# Patient Record
Sex: Female | Born: 1995 | Race: Black or African American | Hispanic: Yes | Marital: Single | Smoking: Never smoker
Health system: Southern US, Community
[De-identification: ages and names within clinical notes are randomized; demographics above are authoritative.]

## PROBLEM LIST (undated history)

## (undated) ENCOUNTER — Emergency Department (HOSPITAL_COMMUNITY): Admission: EM | Disposition: A | Discharge status: Home / Self Care | Payer: Self-pay

## (undated) ENCOUNTER — Inpatient Hospital Stay (HOSPITAL_COMMUNITY): Payer: Self-pay

## (undated) DIAGNOSIS — R51 Headache: Secondary | ICD-10-CM

## (undated) DIAGNOSIS — B999 Unspecified infectious disease: Secondary | ICD-10-CM

## (undated) DIAGNOSIS — A749 Chlamydial infection, unspecified: Secondary | ICD-10-CM

## (undated) DIAGNOSIS — R519 Headache, unspecified: Secondary | ICD-10-CM

## (undated) DIAGNOSIS — E162 Hypoglycemia, unspecified: Secondary | ICD-10-CM

## (undated) HISTORY — PX: WISDOM TOOTH EXTRACTION: SHX21

## (undated) HISTORY — PX: DILATION AND CURETTAGE OF UTERUS: SHX78

---
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
## 2014-02-02 ENCOUNTER — Encounter (HOSPITAL_COMMUNITY): Payer: Self-pay | Admitting: Emergency Medicine

## 2014-02-02 ENCOUNTER — Emergency Department (HOSPITAL_COMMUNITY)
Admission: EM | Admit: 2014-02-02 | Discharge: 2014-02-02 | Disposition: A | Discharge status: Home / Self Care | Payer: Medicaid Other | Attending: Emergency Medicine | Admitting: Emergency Medicine

## 2014-02-02 DIAGNOSIS — N938 Other specified abnormal uterine and vaginal bleeding: Secondary | ICD-10-CM | POA: Diagnosis not present

## 2014-02-02 DIAGNOSIS — Z3202 Encounter for pregnancy test, result negative: Secondary | ICD-10-CM | POA: Insufficient documentation

## 2014-02-02 DIAGNOSIS — N926 Irregular menstruation, unspecified: Secondary | ICD-10-CM | POA: Diagnosis not present

## 2014-02-02 DIAGNOSIS — Z79899 Other long term (current) drug therapy: Secondary | ICD-10-CM | POA: Diagnosis not present

## 2014-02-02 DIAGNOSIS — L259 Unspecified contact dermatitis, unspecified cause: Secondary | ICD-10-CM | POA: Diagnosis not present

## 2014-02-02 DIAGNOSIS — Z8679 Personal history of other diseases of the circulatory system: Secondary | ICD-10-CM | POA: Diagnosis not present

## 2014-02-02 DIAGNOSIS — N939 Abnormal uterine and vaginal bleeding, unspecified: Secondary | ICD-10-CM | POA: Diagnosis present

## 2014-02-02 DIAGNOSIS — Z8639 Personal history of other endocrine, nutritional and metabolic disease: Secondary | ICD-10-CM | POA: Diagnosis not present

## 2014-02-02 DIAGNOSIS — N921 Excessive and frequent menstruation with irregular cycle: Secondary | ICD-10-CM

## 2014-02-02 HISTORY — DX: Hypoglycemia, unspecified: E16.2

## 2014-02-02 LAB — HEMOGLOBIN AND HEMATOCRIT, BLOOD
HEMATOCRIT: 43 % (ref 36.0–46.0)
HEMOGLOBIN: 14.3 g/dL (ref 12.0–15.0)

## 2014-02-02 LAB — WET PREP, GENITAL
Trich, Wet Prep: NONE SEEN
WBC, Wet Prep HPF POC: NONE SEEN
YEAST WET PREP: NONE SEEN

## 2014-02-02 LAB — POC URINE PREG, ED: PREG TEST UR: NEGATIVE

## 2014-02-02 MED ORDER — DIPHENHYDRAMINE HCL 25 MG PO TABS: 25.0000 mg | ORAL_TABLET | Freq: Four times a day (QID) | ORAL | Status: DC | PRN

## 2014-02-02 MED ORDER — DIPHENHYDRAMINE HCL 25 MG PO CAPS
50.0000 mg | ORAL_CAPSULE | Freq: Once | ORAL | Status: AC
  Administered 2014-02-02: 50 mg via ORAL
  Filled 2014-02-02: qty 2

## 2014-02-02 MED ORDER — FAMOTIDINE 20 MG PO TABS: 20.0000 mg | ORAL_TABLET | Freq: Two times a day (BID) | ORAL | Status: DC

## 2014-02-02 MED ORDER — PREDNISONE 20 MG PO TABS: 40.0000 mg | ORAL_TABLET | Freq: Every day | ORAL | Status: DC

## 2014-02-02 MED ORDER — FAMOTIDINE 20 MG PO TABS
20.0000 mg | ORAL_TABLET | Freq: Once | ORAL | Status: AC
  Administered 2014-02-02: 20 mg via ORAL
  Filled 2014-02-02: qty 1

## 2014-02-02 MED ORDER — HYDROCORTISONE 2.5 % EX LOTN: TOPICAL_LOTION | Freq: Two times a day (BID) | CUTANEOUS | Status: DC

## 2014-02-02 MED ORDER — PREDNISONE 20 MG PO TABS
60.0000 mg | ORAL_TABLET | Freq: Once | ORAL | Status: AC
  Administered 2014-02-02: 60 mg via ORAL
  Filled 2014-02-02: qty 3

## 2014-02-02 NOTE — ED Notes (Signed)
Pt reports that she has been having vaginal bleeding since 10/25 after having her first Depo injection.  Pt states that she has a rash to her face and torso for the past week. Pt has used cream at home without relief.

## 2014-02-02 NOTE — ED Provider Notes (Signed)
CSN: [id number]     Arrival date & time 02/02/14  1518 History   First MD Initiated Contact with Patient 02/02/14 1633     Chief Complaint  Patient presents with  . Vaginal Bleeding     (Consider location/radiation/quality/duration/timing/severity/associated sxs/prior Treatment) Patient is a 19 y.o. female presenting with vaginal bleeding. The history is provided by the patient and medical records. No language interpreter was used.  Vaginal Bleeding Associated symptoms: no abdominal pain, no back pain, no dysuria, no fatigue, no fever and no nausea      Erin Harrison is a 19 y.o. female  with a hx of hypoglycemia, migraines presents to the Emergency Department complaining of gradual, persistent worsening vaginal bleeding onset in [place]. Pt reports depo shot and began her menses on 11/01/13.  She reports that she has had spotting since that time.  She is sexually active but regularly uses condoms.  No hx of STD.  Associated symptoms include lower abdominal cramping described as similar to menstrual cramps. No treatments attempted prior to arrival. Nothing makes symptoms better or worse. Patient reports she was to see her gynecologist today but there was a problem with her insurance she came here to the emergency department instead.  Pt denies fever, chills, headache, neck pain, chest pain, shortness of breath, nausea, vomiting, diarrhea, weakness, dizziness, syncope, dysuria, hematuria..    Pt also reports papular rash beginning on the face approx 1 week ago.  Now covering her chest, back, arms and groin.  Pt has tried cortisone and benadryl without relief.  No new environmental exposures including new lotions, perfumes, makeup, clothing or environments.      Past Medical History  Diagnosis Date  . Hypoglycemia    History reviewed. No pertinent past surgical history. History reviewed. No pertinent family history. History  Substance Use Topics  . Smoking status: Never Smoker   .  Smokeless tobacco: Never Used  . Alcohol Use: No   OB History    No data available     Review of Systems  Constitutional: Negative for fever, diaphoresis, appetite change, fatigue and unexpected weight change.  HENT: Negative for mouth sores.   Eyes: Negative for visual disturbance.  Respiratory: Negative for cough, chest tightness, shortness of breath and wheezing.   Cardiovascular: Negative for chest pain.  Gastrointestinal: Negative for nausea, vomiting, abdominal pain, diarrhea and constipation.  Endocrine: Negative for polydipsia, polyphagia and polyuria.  Genitourinary: Positive for vaginal bleeding. Negative for dysuria, urgency, frequency and hematuria.  Musculoskeletal: Negative for back pain and neck stiffness.  Skin: Positive for rash.  Allergic/Immunologic: Negative for immunocompromised state.  Neurological: Negative for syncope, light-headedness and headaches.  Hematological: Does not bruise/bleed easily.  Psychiatric/Behavioral: Negative for sleep disturbance. The patient is not nervous/anxious.       Allergies  Review of patient's allergies indicates no known allergies.  Home Medications   Prior to Admission medications   Medication Sig Start Date End Date Taking? Authorizing Provider  diphenhydrAMINE (SOMINEX) 25 MG tablet Take 25 mg by mouth daily as needed (rash).   Yes Historical Provider, MD  diphenhydrAMINE (BENADRYL) 25 MG tablet Take 1 tablet (25 mg total) by mouth every 6 (six) hours as needed for itching (Rash). 02/02/14   Chavez Rosol, PA-C  famotidine (PEPCID) 20 MG tablet Take 1 tablet (20 mg total) by mouth 2 (two) times daily. 02/02/14   Lamonta Cypress, PA-C  hydrocortisone 2.5 % lotion Apply topically 2 (two) times daily. 02/02/14   Dahlia Client Shellee Streng, PA-C  naproxen sodium (ANAPROX) 220 MG tablet Take 220 mg by mouth once.    Historical Provider, MD  Norgestim-Eth Estrad Triphasic (ORTHO TRI-CYCLEN, 28, PO) Take 1 tablet by mouth daily.     Historical Provider, MD  predniSONE (DELTASONE) 20 MG tablet Take 2 tablets (40 mg total) by mouth daily. 02/02/14   Jabori Henegar, PA-C  promethazine (PHENERGAN) 25 MG tablet Take 1 tablet (25 mg total) by mouth every 8 (eight) hours as needed for nausea or vomiting. 01/24/13   Jamesetta Orleanshristopher W Lawyer, PA-C  sulfamethoxazole-trimethoprim (SEPTRA DS) 800-160 MG per tablet Take 1 tablet by mouth 2 (two) times daily. 01/24/13   Jamesetta Orleanshristopher W Lawyer, PA-C   BP 122/75 mmHg  Pulse 80  Temp(Src) 98.3 F (36.8 C) (Oral)  Resp 16  SpO2 99%  LMP 02/02/2014 Physical Exam  Constitutional: She appears well-developed and well-nourished. No distress.  HENT:  Head: Normocephalic and atraumatic.  Eyes: Conjunctivae are normal. No scleral icterus.  Neck: Normal range of motion. Neck supple.  Cardiovascular: Normal rate, regular rhythm, normal heart sounds and intact distal pulses.   No murmur heard. Pulmonary/Chest: Effort normal and breath sounds normal. No respiratory distress. She has no wheezes.  Abdominal: Soft. Bowel sounds are normal. She exhibits no mass. There is no hepatosplenomegaly. There is no tenderness. There is no rebound, no guarding and no CVA tenderness. Hernia confirmed negative in the right inguinal area and confirmed negative in the left inguinal area.  Genitourinary: Uterus normal. Pelvic exam was performed with patient supine. There is no rash, tenderness, lesion or injury on the right labia. There is no rash, tenderness, lesion or injury on the left labia. Uterus is not deviated, not enlarged, not fixed and not tender. Cervix exhibits no motion tenderness, no discharge and no friability. Right adnexum displays no mass, no tenderness and no fullness. Left adnexum displays no mass, no tenderness and no fullness. There is bleeding in the vagina. No erythema or tenderness in the vagina. No foreign body around the vagina. No signs of injury around the vagina. No vaginal discharge found.   Small amount of blood in the vaginal vault No vaginal discharge No cervical motion tenderness  Musculoskeletal: Normal range of motion. She exhibits no edema.  Lymphadenopathy:    She has no cervical adenopathy.       Right: No inguinal adenopathy present.       Left: No inguinal adenopathy present.  Neurological: She is alert. Coordination normal.  Speech is clear and goal oriented Moves extremities without ataxia  Skin: Skin is warm and dry. Rash noted. She is not diaphoretic. No erythema.  Raised, erythematous rash with multiple excoriations without evidence of secondary infection  Psychiatric: She has a normal mood and affect.  Nursing note and vitals reviewed.   ED Course  Procedures (including critical care time) Labs Review Labs Reviewed  WET PREP, GENITAL - Abnormal; Notable for the following:    Clue Cells Wet Prep HPF POC FEW (*)    All other components within normal limits  GC/CHLAMYDIA PROBE AMP  HEMOGLOBIN AND HEMATOCRIT, BLOOD  POC URINE PREG, ED    Imaging Review No results found.   EKG Interpretation None      MDM   Final diagnoses:  Dysfunctional uterine bleeding  Breakthrough bleeding on depo provera  Contact dermatitis   Erin Harrison presents with multiple complaints. Rash consistent with contact dermatitis. Patient denies any difficulty breathing or swallowing.  Pt has a patent airway without stridor and is handling  secretions without difficulty; no angioedema. No concern for superimposed infection. No concern for SJS, TEN, TSS, tick borne illness, syphilis or other life-threatening condition. Will discharge home with short course of steroids, pepcid and recommend Benadryl as needed for pruritis.  Pt's vaginal bleeding is consistent with breakthrough bleeding on her Depo-Provera. This is patient's first experience with this contraceptive. Only a small amount of blood in the vaginal vault. No cervical motion tenderness or evidence of infection.  Abdomen is soft and nontender.  Patient is to follow with GYN for second Depo-Provera shot and further evaluation of her vaginal bleeding.  No evidence of anemia on lab work.  I have personally reviewed patient's vitals, nursing note and any pertinent labs or imaging.  I performed an undressed physical exam.    It has been determined that no acute conditions requiring further emergency intervention are present at this time. The patient/guardian have been advised of the diagnosis and plan. I reviewed all labs and imaging including any potential incidental findings. We have discussed signs and symptoms that warrant return to the ED and they are listed in the discharge instructions.    Vital signs are stable at discharge.   BP 122/75 mmHg  Pulse 80  Temp(Src) 98.3 F (36.8 C) (Oral)  Resp 16  SpO2 99%  LMP 02/02/2014          Dierdre Forth, PA-C 02/02/14 1836  Suzi Roots, MD 02/02/14 2129

## 2014-02-02 NOTE — Discharge Instructions (Signed)
1. Medications: Prednisone, Benadryl, Pepcid, hydrocortisone lotion, usual home medications 2. Treatment: rest, drink plenty of fluids, take medications as prescribed 3. Follow Up: Please follow-up with your primary doctor in 3 days for discussion of your diagnoses and further evaluation after today's visit; if you do not have a primary care doctor use the resource guide provided to find one; follow-up with dermatology as needed if your rash persists; Also follow-up with your GYN for further evaluation of your vaginal bleeding; Return to the ER for difficulty breathing, return of allergic reaction or other concerning symptoms    Contact Dermatitis Contact dermatitis is a rash that happens when something touches the skin. You touched something that irritates your skin, or you have allergies to something you touched. HOME CARE   Avoid the thing that caused your rash.  Keep your rash away from hot water, soap, sunlight, chemicals, and other things that might bother it.  Do not scratch your rash.  You can take cool baths to help stop itching.  Only take medicine as told by your doctor.  Keep all doctor visits as told. GET HELP RIGHT AWAY IF:   Your rash is not better after 3 days.  Your rash gets worse.  Your rash is puffy (swollen), tender, red, sore, or warm.  You have problems with your medicine. MAKE SURE YOU:   Understand these instructions.  Will watch your condition.  Will get help right away if you are not doing well or get worse. Document Released: 11/11/2008 Document Revised: 04/08/2011 Document Reviewed: 06/19/2010 [facility] Patient Information 2015 [place], [place]. This information is not intended to replace advice given to you by your health care provider. Make sure you discuss any questions you have with your health care provider.

## 2014-02-03 LAB — GC/CHLAMYDIA PROBE AMP
CT Probe RNA: POSITIVE — AB
GC Probe RNA: NEGATIVE

## 2014-02-04 ENCOUNTER — Telehealth (HOSPITAL_COMMUNITY): Payer: Self-pay

## 2014-02-04 NOTE — Telephone Encounter (Signed)
Results received from [facility].  (+) Chlamydia   No antibiotic treatment or prescription given for STD.  Chart to MD office for review.  [facility] form attached. 

## 2014-02-05 ENCOUNTER — Telehealth (HOSPITAL_BASED_OUTPATIENT_CLINIC_OR_DEPARTMENT_OTHER): Payer: Self-pay | Admitting: Emergency Medicine

## 2014-02-07 ENCOUNTER — Telehealth (HOSPITAL_BASED_OUTPATIENT_CLINIC_OR_DEPARTMENT_OTHER): Payer: Self-pay | Admitting: *Deleted

## 2014-05-17 ENCOUNTER — Encounter (HOSPITAL_COMMUNITY): Payer: Self-pay | Admitting: Emergency Medicine

## 2014-05-17 ENCOUNTER — Emergency Department (HOSPITAL_COMMUNITY)
Admission: EM | Admit: 2014-05-17 | Discharge: 2014-05-17 | Disposition: A | Discharge status: Home / Self Care | Payer: Medicaid Other | Attending: Emergency Medicine | Admitting: Emergency Medicine

## 2014-05-17 DIAGNOSIS — Z79899 Other long term (current) drug therapy: Secondary | ICD-10-CM | POA: Insufficient documentation

## 2014-05-17 DIAGNOSIS — Z7952 Long term (current) use of systemic steroids: Secondary | ICD-10-CM | POA: Insufficient documentation

## 2014-05-17 DIAGNOSIS — Z792 Long term (current) use of antibiotics: Secondary | ICD-10-CM | POA: Insufficient documentation

## 2014-05-17 DIAGNOSIS — J069 Acute upper respiratory infection, unspecified: Secondary | ICD-10-CM | POA: Insufficient documentation

## 2014-05-17 DIAGNOSIS — R05 Cough: Secondary | ICD-10-CM

## 2014-05-17 DIAGNOSIS — J029 Acute pharyngitis, unspecified: Secondary | ICD-10-CM

## 2014-05-17 DIAGNOSIS — Z8639 Personal history of other endocrine, nutritional and metabolic disease: Secondary | ICD-10-CM | POA: Diagnosis not present

## 2014-05-17 DIAGNOSIS — R059 Cough, unspecified: Secondary | ICD-10-CM

## 2014-05-17 LAB — RAPID STREP SCREEN (MED CTR MEBANE ONLY): STREPTOCOCCUS, GROUP A SCREEN (DIRECT): NEGATIVE

## 2014-05-17 NOTE — ED Provider Notes (Signed)
CSN: [id number]     Arrival date & time 05/17/14  2048 History   This chart was scribed for Erin StraussMercedes Camprubi-Soms, PA-C working with Erin LovelessScott Goldston, MD by Erin Harrison, ED Scribe. This patient was seen in room TR07C/TR07C and the patient's care was started at 9:10 PM.     Chief Complaint  Patient presents with  . Sore Throat   Patient is a 19 y.o. female presenting with pharyngitis. The history is provided by the patient. No language interpreter was used.  Sore Throat This is a new problem. The current episode started yesterday. The problem occurs rarely. The problem has not changed since onset.Pertinent negatives include no chest pain, no abdominal pain and no shortness of breath. The symptoms are aggravated by swallowing. Nothing relieves the symptoms. She has tried nothing for the symptoms. The treatment provided no relief.   HPI Comments: Erin Harrison is a 19 y.o. healthy female who presents to the Emergency Department complaining of sore throat onset 1 Harrison prior. Pt described the pain as a soreness. Pt rates the pain 8/10, intermittent, nonradiating, worse when swallowing. No known alleviating factors given that she has not tried anything PTA.  Pt states she had associated subjective fever (unknown temp), chills, non productive cough, and "white pus spots" on her tonsils. Pt states that her roommate recently was diagnosed with strep throat. Denies rhinorrhea, ear pain/discharge, eye itching/drainage, drooling, trismus, difficulty swallowing, neck pain, CP, SOB, wheezing, stridor, abdominal pain, n/v/d/c, dysuria, hematuria, vaginal bleeding or discharge, numbness, tingling, weakness, or rashes. LMP 3 weeks ago. No known allergies, states she had a rash when given an antibiotic over the summer but can't recall what it was. No known environmental allergies.    Past Medical History  Diagnosis Date  . Hypoglycemia    History reviewed. No pertinent past surgical history. History  reviewed. No pertinent family history. History  Substance Use Topics  . Smoking status: Never Smoker   . Smokeless tobacco: Never Used  . Alcohol Use: No   OB History    No data available     Review of Systems  Constitutional: Positive for fever (subjective, unsure of temp) and chills.  HENT: Positive for sore throat. Negative for congestion, drooling, ear discharge, ear pain, rhinorrhea and trouble swallowing.   Eyes: Negative for discharge and itching.  Respiratory: Positive for cough (dry, nonproductive). Negative for shortness of breath and wheezing.   Cardiovascular: Negative for chest pain.  Gastrointestinal: Negative for nausea, vomiting, abdominal pain, diarrhea and constipation.  Genitourinary: Negative for dysuria, hematuria, vaginal bleeding and vaginal discharge.  Musculoskeletal: Negative for myalgias, arthralgias and neck pain.  Skin: Negative for rash.  Allergic/Immunologic: Negative for environmental allergies and immunocompromised state.  Neurological: Negative for weakness and numbness.   10 Systems reviewed and all are negative for acute change except as noted in the HPI.   Allergies  Review of patient's allergies indicates no known allergies.  Home Medications   Prior to Admission medications   Medication Sig Start Date End Date Taking? Authorizing Provider  diphenhydrAMINE (BENADRYL) 25 MG tablet Take 1 tablet (25 mg total) by mouth every 6 (six) hours as needed for itching (Rash). 02/02/14   Erin Muthersbaugh, PA-C  diphenhydrAMINE (SOMINEX) 25 MG tablet Take 25 mg by mouth daily as needed (rash).    Historical Provider, MD  famotidine (PEPCID) 20 MG tablet Take 1 tablet (20 mg total) by mouth 2 (two) times daily. 02/02/14   Erin Muthersbaugh, PA-C  hydrocortisone 2.5 % lotion  Apply topically 2 (two) times daily. 02/02/14   Erin Muthersbaugh, PA-C  naproxen sodium (ANAPROX) 220 MG tablet Take 220 mg by mouth once.    Historical Provider, MD  Norgestim-Eth  Estrad Triphasic (ORTHO TRI-CYCLEN, 28, PO) Take 1 tablet by mouth daily.    Historical Provider, MD  predniSONE (DELTASONE) 20 MG tablet Take 2 tablets (40 mg total) by mouth daily. 02/02/14   Erin Muthersbaugh, PA-C  promethazine (PHENERGAN) 25 MG tablet Take 1 tablet (25 mg total) by mouth every 8 (eight) hours as needed for nausea or vomiting. 01/24/13   Erin Night, PA-C  sulfamethoxazole-trimethoprim (SEPTRA DS) 800-160 MG per tablet Take 1 tablet by mouth 2 (two) times daily. 01/24/13   Erin Night, PA-C   BP 111/73 mmHg  Pulse 77  Temp(Src) 99 F (37.2 C) (Oral)  Resp 15  Ht  (1.6 m)  Wt 132 lb 4.8 oz (60.011 kg)  BMI 23.44 kg/m2  SpO2 99%  LMP 04/26/2014 (Approximate)   Physical Exam  Constitutional: She is oriented to person, place, and time. Vital signs are normal. She appears well-developed and well-nourished.  Non-toxic appearance. No distress.  Low grade temp 49F, nontoxic, NAD  HENT:  Head: Normocephalic and atraumatic.  Right Ear: Hearing, tympanic membrane, external ear and ear canal normal.  Left Ear: Hearing, tympanic membrane, external ear and ear canal normal.  Nose: Nose normal.  Mouth/Throat: Uvula is midline and mucous membranes are normal. No trismus in the jaw. No uvula swelling. Oropharyngeal exudate, posterior oropharyngeal edema and posterior oropharyngeal erythema present. No tonsillar abscesses.  Ears clear bilaterally  Nose clear oropharynx with Bilateral tonsillar edema 1+, exudates, and erythema without evidence of PTA. No trismus or drooling, uvula midline without swelling.   Eyes: Conjunctivae and EOM are normal. Right eye exhibits no discharge. Left eye exhibits no discharge.  Neck: Normal range of motion. Neck supple.  Cardiovascular: Normal rate, regular rhythm, normal heart sounds and intact distal pulses.  Exam reveals no gallop and no friction rub.   No murmur heard. Pulmonary/Chest: Effort normal and breath sounds normal.  No respiratory distress. She has no decreased breath sounds. She has no wheezes. She has no rhonchi. She has no rales.  CTAB in all lung fields, no w/r/r, no hypoxia or increased WOB, speaking in full sentences, SpO2 99% on RA  Abdominal: Soft. Normal appearance and bowel sounds are normal. She exhibits no distension. There is no tenderness. There is no rigidity, no rebound and no guarding.  Musculoskeletal: Normal range of motion.  MAE x4 Strength and sensation grossly intact Distal pulses intact  Lymphadenopathy:       Head (right side): Tonsillar adenopathy present. No submandibular adenopathy present.       Head (left side): Tonsillar adenopathy present. No submandibular adenopathy present.    She has cervical adenopathy.  Shotty anterior cervical adenopathy and tonsillar adenopathy bilaterally with TTP.  Neurological: She is alert and oriented to person, place, and time. She has normal strength. No sensory deficit.  Skin: Skin is warm, dry and intact. No rash noted.  Psychiatric: She has a normal mood and affect.  Nursing note and vitals reviewed.   ED Course  Procedures (including critical care time) DIAGNOSTIC STUDIES: Oxygen Saturation is 99% on RA, normal by my interpretation.    COORDINATION OF CARE: 9:23 PM-Discussed treatment plan with pt at bedside and pt agreed to plan.     Labs Review Labs Reviewed  RAPID STREP SCREEN  CULTURE, GROUP A STREP  Imaging Review No results found.   EKG Interpretation None      MDM   Final diagnoses:  Pharyngitis  Cough  URI (upper respiratory infection)    19 y.o. female here with sore throat and dry cough x1 day. +Sick contacts at home. +Tonsillar exudates and erythema on exam, low grade temp here, subjective fever at home. No trismus or drooling, no PTA or signs of ludwig's/deep space infection. Mild b/l LAD, clear lung exam. Will obtain strep test since CENTOR criteria moderate. Discussed warm salt water gargles and  chloraseptic spray. Will await strep test results.  9:47 PM Strep test negative, will await culture results since pt has moderate CENTOR criteria. Discussed that if culture returns positive, lab will call her. Will have her see PCP in 1wk. I explained the diagnosis and have given explicit precautions to return to the ER including for any other new or worsening symptoms. The patient understands and accepts the medical plan as it's been dictated and I have answered their questions. Discharge instructions concerning home care and prescriptions have been given. The patient is STABLE and is discharged to home in good condition.    I personally performed the services described in this documentation, which was scribed in my presence. The recorded information has been reviewed and is accurate.  BP 111/73 mmHg  Pulse 77  Temp(Src) 99 F (37.2 C) (Oral)  Resp 15  Ht  (1.6 m)  Wt 132 lb 4.8 oz (60.011 kg)  BMI 23.44 kg/m2  SpO2 99%  LMP 04/26/2014 (Approximate)   Naseem Adler Camprubi-Soms, PA-C 05/17/14 2150  Erin Loveless, MD 05/21/14 [phone]

## 2014-05-17 NOTE — ED Notes (Addendum)
Patient with cough and sore throat since last night.  Patient denies any nasal congestion.  She states she has had a fever this morning.  Patient's roommate had strep x2 recently.

## 2014-05-17 NOTE — Discharge Instructions (Signed)
Continue to stay well-hydrated. Gargle warm salt water and spit it out. Use chloraseptic spray as needed for pain. Continue to alternate between Tylenol and Ibuprofen for pain or fever. Use Mucinex for cough suppression/expectoration of mucus. May consider over-the-counter Benadryl or other antihistamine to decrease secretions and for watery itchy eyes. Followup with your primary care doctor in 5-7 days for recheck of ongoing symptoms. Return to emergency department for emergent changing or worsening of symptoms.   Cough, Adult  A cough is a reflex. It helps you clear your throat and airways. A cough can help heal your body. A cough can last 2 or 3 weeks (acute) or may last more than 8 weeks (chronic). Some common causes of a cough can include an infection, allergy, or a cold. HOME CARE  Only take medicine as told by your doctor.  If given, take your medicines (antibiotics) as told. Finish them even if you start to feel better.  Use a cold steam vaporizer or humidifier in your home. This can help loosen thick spit (secretions).  Sleep so you are almost sitting up (semi-upright). Use pillows to do this. This helps reduce coughing.  Rest as needed.  Stop smoking if you smoke. GET HELP RIGHT AWAY IF:  You have yellowish-white fluid (pus) in your thick spit.  Your cough gets worse.  Your medicine does not reduce coughing, and you are losing sleep.  You cough up blood.  You have trouble breathing.  Your pain gets worse and medicine does not help.  You have a fever. MAKE SURE YOU:   Understand these instructions.  Will watch your condition.  Will get help right away if you are not doing well or get worse. Document Released: 09/27/2010 Document Revised: 05/31/2013 Document Reviewed: 09/27/2010 [facility] Patient Information 2015 [place], [place]. This information is not intended to replace advice given to you by your health care provider. Make sure you discuss any questions you have  with your health care provider.  Pharyngitis Pharyngitis is a sore throat (pharynx). There is redness, pain, and swelling of your throat. HOME CARE   Drink enough fluids to keep your pee (urine) clear or pale yellow.  Only take medicine as told by your doctor.  You may get sick again if you do not take medicine as told. Finish your medicines, even if you start to feel better.  Do not take aspirin.  Rest.  Rinse your mouth (gargle) with salt water ( tsp of salt per 1 qt of water) every 1-2 hours. This will help the pain.  If you are not at risk for choking, you can suck on hard candy or sore throat lozenges. GET HELP IF:  You have large, tender lumps on your neck.  You have a rash.  You cough up green, yellow-brown, or bloody spit. GET HELP RIGHT AWAY IF:   You have a stiff neck.  You drool or cannot swallow liquids.  You throw up (vomit) or are not able to keep medicine or liquids down.  You have very bad pain that does not go away with medicine.  You have problems breathing (not from a stuffy nose). MAKE SURE YOU:   Understand these instructions.  Will watch your condition.  Will get help right away if you are not doing well or get worse. Document Released: 07/03/2007 Document Revised: 11/04/2012 Document Reviewed: 09/21/2012 [facility] Patient Information 2015 [place], [place]. This information is not intended to replace advice given to you by your health care provider. Make sure you  discuss any questions you have with your health care provider.  Salt Water Gargle This solution will help make your mouth and throat feel better. HOME CARE INSTRUCTIONS   Mix 1 teaspoon of salt in 8 ounces of warm water.  Gargle with this solution as much or often as you need or as directed. Swish and gargle gently if you have any sores or wounds in your mouth.  Do not swallow this mixture. Document Released: 10/19/2003 Document Revised: 04/08/2011 Document Reviewed:  03/11/2008 [facility] Patient Information 2015 [place], [place]. This information is not intended to replace advice given to you by your health care provider. Make sure you discuss any questions you have with your health care provider.

## 2014-05-20 LAB — CULTURE, GROUP A STREP: Strep A Culture: NEGATIVE

## 2014-09-29 ENCOUNTER — Emergency Department (INDEPENDENT_AMBULATORY_CARE_PROVIDER_SITE_OTHER)
Admission: EM | Admit: 2014-09-29 | Discharge: 2014-09-29 | Disposition: A | Discharge status: Home / Self Care | Payer: Medicaid Other | Source: Home / Self Care | Attending: Emergency Medicine | Admitting: Emergency Medicine

## 2014-09-29 ENCOUNTER — Encounter (HOSPITAL_COMMUNITY): Payer: Self-pay | Admitting: Emergency Medicine

## 2014-09-29 DIAGNOSIS — N390 Urinary tract infection, site not specified: Secondary | ICD-10-CM | POA: Diagnosis not present

## 2014-09-29 LAB — POCT URINALYSIS DIP (DEVICE)
Bilirubin Urine: NEGATIVE
GLUCOSE, UA: NEGATIVE mg/dL
Ketones, ur: NEGATIVE mg/dL
NITRITE: NEGATIVE
PROTEIN: 100 mg/dL — AB
SPECIFIC GRAVITY, URINE: 1.02 (ref 1.005–1.030)
UROBILINOGEN UA: 1 mg/dL (ref 0.0–1.0)
pH: 7.5 (ref 5.0–8.0)

## 2014-09-29 LAB — POCT PREGNANCY, URINE: Preg Test, Ur: NEGATIVE

## 2014-09-29 MED ORDER — CEPHALEXIN 500 MG PO CAPS: 500.0000 mg | ORAL_CAPSULE | Freq: Four times a day (QID) | ORAL | Status: AC

## 2014-09-29 NOTE — ED Notes (Signed)
Low back pain yesterday, none today.  Patient has reported blood in urine, reports abdominal pain at the end of urinary stream, going to urinate more often, and sometimes having difficulty holding urine

## 2014-09-29 NOTE — ED Provider Notes (Signed)
CSN: [id number]     Arrival date & time 09/29/14  1539 History   First MD Initiated Contact with Patient 09/29/14 1559     Chief Complaint  Patient presents with  . Hematuria  . Urinary Tract Infection   (Consider location/radiation/quality/duration/timing/severity/associated sxs/prior Treatment) HPI Patient is a 19 year old female presents with 2 days history of blood in urine. Says blood is moderate amount in the urine, does not think it is vaginal as she had her period 2 weeks ago. She has associated urinary urgency and burning with urination that is mostly toward the end of the stream. Some sharp, mild lower abdominal pain. No back pain. She reports she was recently treated for yeast and bacterial vaginosis approximately 2 weeks ago (also reports being tested for STDs at that time and was negative). Reports she stopped using the depo shot in January; she has had a history of breakthrough bleeding while on depo. Not currently on birth control.   Past Medical History  Diagnosis Date  . Hypoglycemia    History reviewed. No pertinent past surgical history. No family history on file. Social History  Substance Use Topics  . Smoking status: Never Smoker   . Smokeless tobacco: Never Used  . Alcohol Use: No   OB History    No data available     Review of Systems  Constitutional: Negative for fever, chills and fatigue.  HENT: Negative for trouble swallowing.   Respiratory: Negative for cough and shortness of breath.   Cardiovascular: Negative for chest pain.  Gastrointestinal: Positive for abdominal pain (lower). Negative for nausea, vomiting, diarrhea, constipation and blood in stool.  Genitourinary: Positive for dysuria and urgency. Negative for flank pain, decreased urine volume, vaginal discharge, difficulty urinating and pelvic pain.  Skin: Negative for rash.    Allergies  Review of patient's allergies indicates no known allergies.  Home Medications   Prior to Admission  medications   Medication Sig Start Date End Date Taking? Authorizing Provider  diphenhydrAMINE (BENADRYL) 25 MG tablet Take 1 tablet (25 mg total) by mouth every 6 (six) hours as needed for itching (Rash). 02/02/14   Hannah Muthersbaugh, PA-C  diphenhydrAMINE (SOMINEX) 25 MG tablet Take 25 mg by mouth daily as needed (rash).    Historical Provider, MD  famotidine (PEPCID) 20 MG tablet Take 1 tablet (20 mg total) by mouth 2 (two) times daily. Patient not taking: Reported on 09/29/2014 02/02/14   Dahlia Client Muthersbaugh, PA-C  hydrocortisone 2.5 % lotion Apply topically 2 (two) times daily. 02/02/14   Hannah Muthersbaugh, PA-C  naproxen sodium (ANAPROX) 220 MG tablet Take 220 mg by mouth once.    Historical Provider, MD  Norgestim-Eth Estrad Triphasic (ORTHO TRI-CYCLEN, 28, PO) Take 1 tablet by mouth daily.    Historical Provider, MD  predniSONE (DELTASONE) 20 MG tablet Take 2 tablets (40 mg total) by mouth daily. Patient not taking: Reported on 09/29/2014 02/02/14   Dahlia Client Muthersbaugh, PA-C  promethazine (PHENERGAN) 25 MG tablet Take 1 tablet (25 mg total) by mouth every 8 (eight) hours as needed for nausea or vomiting. 01/24/13   Charlestine Night, PA-C  sulfamethoxazole-trimethoprim (SEPTRA DS) 800-160 MG per tablet Take 1 tablet by mouth 2 (two) times daily. Patient not taking: Reported on 09/29/2014 01/24/13   Charlestine Night, PA-C   Meds Ordered and Administered this Visit  Medications - No data to display  BP 116/91 mmHg  Pulse 70  Temp(Src) 98.9 F (37.2 C) (Oral)  Resp 16  SpO2 100%  LMP 09/12/2014 No  data found.   Physical Exam  Constitutional: She is oriented to person, place, and time. She appears well-developed and well-nourished.  HENT:  Head: Normocephalic and atraumatic.  Cardiovascular: Normal rate, regular rhythm, normal heart sounds and intact distal pulses.  Exam reveals no gallop and no friction rub.   No murmur heard. Pulmonary/Chest: Effort normal and breath sounds normal.   Abdominal: Soft. Bowel sounds are normal. She exhibits no mass. There is tenderness (lower abdominal, mild). There is no rebound and no guarding.  Neurological: She is alert and oriented to person, place, and time.  Skin: Skin is warm and dry.  Psychiatric: She has a normal mood and affect. Her behavior is normal. Thought content normal.  Nursing note and vitals reviewed.   ED Course  Procedures (including critical care time)  Labs Review Labs Reviewed  POCT URINALYSIS DIP (DEVICE) - Abnormal; Notable for the following:    Hgb urine dipstick LARGE (*)    Protein, ur 100 (*)    Leukocytes, UA SMALL (*)    All other components within normal limits  POCT PREGNANCY, URINE     MDM   1. UTI (urinary tract infection), uncomplicated    History and UA consistent with uncomplicated UTI. No back pain, no systemic signs of illness concerning for pyelonephritis. Also recently treated for BV/yeast and had negative STD testing 2 weeks ago. Rx keflex 500 QID x 3 days. If hematuria is persistent to return to provider for further evaluation.    Nani Ravens, MD 09/29/14 [phone]

## 2014-09-29 NOTE — Discharge Instructions (Signed)
You most likely have a Urinary Tract Infection (UTI).    Take the antibiotic Keflex 4 times a day for 3 days. If we need to change the antibiotic based on the urine culture result (takes 1-2 days) we will call you.  If the blood in the urine does not get better over the week after finishing the antibiotic you should be seen by a doctor.

## 2014-10-01 LAB — URINE CULTURE: Culture: >=100000

## 2014-10-11 NOTE — ED Notes (Signed)
Final report of UA testing positive for UTI. C&S report shows organism sensitive to Rx provided day of visit

## 2014-12-08 ENCOUNTER — Encounter (HOSPITAL_COMMUNITY): Payer: Self-pay | Admitting: Emergency Medicine

## 2014-12-08 ENCOUNTER — Emergency Department (INDEPENDENT_AMBULATORY_CARE_PROVIDER_SITE_OTHER)
Admission: EM | Admit: 2014-12-08 | Discharge: 2014-12-08 | Disposition: A | Discharge status: Home / Self Care | Payer: Medicaid Other | Source: Home / Self Care | Attending: Family Medicine | Admitting: Family Medicine

## 2014-12-08 DIAGNOSIS — J069 Acute upper respiratory infection, unspecified: Secondary | ICD-10-CM

## 2014-12-08 MED ORDER — GUAIFENESIN ER 600 MG PO TB12: 600.0000 mg | ORAL_TABLET | Freq: Two times a day (BID) | ORAL | Status: DC

## 2014-12-08 NOTE — Discharge Instructions (Signed)
It was a pleasure to see you today.  I believe your symptoms are caused by a viral respiratory infection.   For your symptoms, I recommend the following:   1. Guaifenesin 600mg  tablets, take 1-2 tablets by mouth every 12 hours to thin mucus and secretions.   2. If you have generalized aches and malaise, you may take ibuprofen 200mg  tablets, 2-4 tablets by mouth every 6 hours with something to eat, as needed.   I expect you will begin to feel better in the coming 1-3 days.

## 2014-12-08 NOTE — ED Provider Notes (Signed)
CSN: [id number]     Arrival date & time 12/08/14  1326 History   First MD Initiated Contact with Patient 12/08/14 1525     Chief Complaint  Patient presents with  . URI   (Consider location/radiation/quality/duration/timing/severity/associated sxs/prior Treatment) Patient is a 19 y.o. female presenting with URI. The history is provided by the patient. No language interpreter was used.  URI Presenting symptoms: congestion, cough and sore throat   Presenting symptoms: no ear pain, no facial pain, no fatigue and no fever   Patient presents for complaint of hoarseness, scratchy throat and cough which began over this past weekend (Nov 5-6). No fevers or chills. Last week (Nov 1) she had sudden onset repetitive nausea/vomiting, which resolved in 2 days. She felt well on the day of Friday 11/04 and then began with the respiratory symptoms that she present with today.  No fevers or chills, no shortness of breath. She does have nasal congestion. Hoarseness, has seen small amount blood in sputum when she coughs heavily.   She is not a cigarette smoker. Roommate has a cough but not otherwise ill.  Patient is not sure if she received a flu shot this season.   Past Medical History  Diagnosis Date  . Hypoglycemia    History reviewed. No pertinent past surgical history. History reviewed. No pertinent family history. Social History  Substance Use Topics  . Smoking status: Never Smoker   . Smokeless tobacco: Never Used  . Alcohol Use: No   OB History    No data available     Review of Systems  Constitutional: Negative for fever and fatigue.  HENT: Positive for congestion and sore throat. Negative for ear pain.   Respiratory: Positive for cough.     Allergies  Review of patient's allergies indicates no known allergies.  Home Medications   Prior to Admission medications   Medication Sig Start Date End Date Taking? Authorizing Provider  diphenhydrAMINE (BENADRYL) 25 MG tablet Take 1 tablet  (25 mg total) by mouth every 6 (six) hours as needed for itching (Rash). 02/02/14   Hannah Muthersbaugh, PA-C  diphenhydrAMINE (SOMINEX) 25 MG tablet Take 25 mg by mouth daily as needed (rash).    Historical Provider, MD  famotidine (PEPCID) 20 MG tablet Take 1 tablet (20 mg total) by mouth 2 (two) times daily. Patient not taking: Reported on 09/29/2014 02/02/14   Dahlia ClientHannah Muthersbaugh, PA-C  hydrocortisone 2.5 % lotion Apply topically 2 (two) times daily. 02/02/14   Hannah Muthersbaugh, PA-C  naproxen sodium (ANAPROX) 220 MG tablet Take 220 mg by mouth once.    Historical Provider, MD  Norgestim-Eth Estrad Triphasic (ORTHO TRI-CYCLEN, 28, PO) Take 1 tablet by mouth daily.    Historical Provider, MD  predniSONE (DELTASONE) 20 MG tablet Take 2 tablets (40 mg total) by mouth daily. Patient not taking: Reported on 09/29/2014 02/02/14   Dahlia ClientHannah Muthersbaugh, PA-C  promethazine (PHENERGAN) 25 MG tablet Take 1 tablet (25 mg total) by mouth every 8 (eight) hours as needed for nausea or vomiting. 01/24/13   Charlestine Nighthristopher Lawyer, PA-C  sulfamethoxazole-trimethoprim (SEPTRA DS) 800-160 MG per tablet Take 1 tablet by mouth 2 (two) times daily. Patient not taking: Reported on 09/29/2014 01/24/13   Charlestine Nighthristopher Lawyer, PA-C   Meds Ordered and Administered this Visit  Medications - No data to display  BP 112/68 mmHg  Pulse 63  Temp(Src) 98.8 F (37.1 C) (Oral)  Resp 16  SpO2 100%  LMP 11/13/2014 (Exact Date) No data found.   Physical Exam  Constitutional: She appears well-developed and well-nourished. No distress.  Generally well appearing, mild hoarsness. No distress  HENT:  Head: Normocephalic and atraumatic.  Right Ear: External ear normal.  Left Ear: External ear normal.  Mouth/Throat: Oropharynx is clear and moist.  Mild erythema in oropharnyx, without exudate. No frontal or maxillary sinus tenderness. Moist mucus membranes.   Eyes: Conjunctivae and EOM are normal. Pupils are equal, round, and reactive to  light. Right eye exhibits no discharge. Left eye exhibits no discharge.  Neck: No tracheal deviation present. No thyromegaly present.  Shotty anterior cervical adenopathy bilaterally.   Cardiovascular: Normal rate, regular rhythm and normal heart sounds.   Pulmonary/Chest: Effort normal and breath sounds normal. No respiratory distress. She has no wheezes. She has no rales. She exhibits no tenderness.  Lymphadenopathy:    She has cervical adenopathy.  Skin: She is not diaphoretic.    ED Course  Procedures (including critical care time)  Labs Review Labs Reviewed - No data to display  Imaging Review No results found.   Visual Acuity Review  Right Eye Distance:   Left Eye Distance:   Bilateral Distance:    Right Eye Near:   Left Eye Near:    Bilateral Near:         MDM   1. Upper respiratory infection    Findings and symptoms suggestive of URI.  Supportive therapy. Discussed reasons for return.      Barbaraann Barthel, MD 12/08/14 [phone]

## 2014-12-08 NOTE — ED Notes (Signed)
Pt states she started with abdominal pain and vomiting last Tuesday for three days.  After that, she started with a dry cough.  Over the last two days she has been suffering from nasal congestion and left ear pain.  Last night she states her throat started hurting from all of the coughing.  She denies any fever.

## 2016-03-18 ENCOUNTER — Ambulatory Visit (HOSPITAL_COMMUNITY)
Admission: EM | Admit: 2016-03-18 | Discharge: 2016-03-18 | Discharge status: Against Medical Advice | Payer: Medicaid Other

## 2016-03-21 ENCOUNTER — Encounter (HOSPITAL_COMMUNITY): Payer: Self-pay

## 2016-03-21 ENCOUNTER — Emergency Department (HOSPITAL_COMMUNITY)
Admission: EM | Admit: 2016-03-21 | Discharge: 2016-03-21 | Disposition: A | Discharge status: Home / Self Care | Payer: Medicaid Other | Attending: Emergency Medicine | Admitting: Emergency Medicine

## 2016-03-21 DIAGNOSIS — L282 Other prurigo: Secondary | ICD-10-CM

## 2016-03-21 DIAGNOSIS — L299 Pruritus, unspecified: Secondary | ICD-10-CM | POA: Insufficient documentation

## 2016-03-21 MED ORDER — HYDROXYZINE HCL 25 MG PO TABS: 25.0000 mg | ORAL_TABLET | Freq: Four times a day (QID) | ORAL | 0 refills | Status: DC

## 2016-03-21 MED ORDER — PREDNISONE 20 MG PO TABS: ORAL_TABLET | ORAL | 0 refills | Status: DC

## 2016-03-21 NOTE — ED Notes (Signed)
ED Provider at bedside. 

## 2016-03-21 NOTE — ED Triage Notes (Signed)
Pt reports rash "little bumps" to body: upper chest and back and thighs.

## 2016-03-21 NOTE — ED Provider Notes (Signed)
[facility]-EMERGENCY DEPT Provider Note   CSN: [id number] Arrival date & time: 03/21/16  2231    By signing my name below, I, Valentino SaxonBianca Contreras, attest that this documentation has been prepared under the direction and in the presence of Felicie Mornavid Cherylene Ferrufino, NP. Electronically Signed: Valentino SaxonBianca Contreras, ED Scribe. 03/21/16. 11:22 PM.  History   Chief Complaint Chief Complaint  Patient presents with  . Rash   The history is provided by the patient. No language interpreter was used.  Rash   This is a new problem. The current episode started more than 2 days ago. The problem has not changed since onset.The problem is associated with nothing. There has been no fever.   HPI Comments: Erin Harrison is a 21 y.o. female who presents to the Emergency Department complaining of acute onset, generalized rashes to body that occurred five days ago. She reports having an outbreak to her chest, back, neck and legs. Pt notes she was having intermittent, abdominal cramps with associated episodic vomiting and diarrhea prior to having the rashes. She reports using Benadryl with minimal relief. Pt denies fever, chills, SOB, tongue swelling. She denies changing laundry detergent or use of new lotions.   Past Medical History:  Diagnosis Date  . Hypoglycemia     There are no active problems to display for this patient.   History reviewed. No pertinent surgical history.  OB History    No data available       Home Medications    Prior to Admission medications   Medication Sig Start Date End Date Taking? Authorizing Provider  diphenhydrAMINE (BENADRYL) 25 MG tablet Take 1 tablet (25 mg total) by mouth every 6 (six) hours as needed for itching (Rash). 02/02/14   Hannah Muthersbaugh, PA-C  diphenhydrAMINE (SOMINEX) 25 MG tablet Take 25 mg by mouth daily as needed (rash).    Historical Provider, MD  famotidine (PEPCID) 20 MG tablet Take 1 tablet (20 mg total) by mouth 2 (two) times daily. Patient not taking:  Reported on 09/29/2014 02/02/14   Dahlia ClientHannah Muthersbaugh, PA-C  guaiFENesin (MUCINEX) 600 MG 12 hr tablet Take 1 tablet (600 mg total) by mouth 2 (two) times daily. 12/08/14   Barbaraann BarthelJames O Breen, MD  hydrocortisone 2.5 % lotion Apply topically 2 (two) times daily. 02/02/14   Hannah Muthersbaugh, PA-C  naproxen sodium (ANAPROX) 220 MG tablet Take 220 mg by mouth once.    Historical Provider, MD  Norgestim-Eth Estrad Triphasic (ORTHO TRI-CYCLEN, 28, PO) Take 1 tablet by mouth daily.    Historical Provider, MD  predniSONE (DELTASONE) 20 MG tablet Take 2 tablets (40 mg total) by mouth daily. Patient not taking: Reported on 09/29/2014 02/02/14   Dahlia ClientHannah Muthersbaugh, PA-C  promethazine (PHENERGAN) 25 MG tablet Take 1 tablet (25 mg total) by mouth every 8 (eight) hours as needed for nausea or vomiting. 01/24/13   Charlestine Nighthristopher Lawyer, PA-C  sulfamethoxazole-trimethoprim (SEPTRA DS) 800-160 MG per tablet Take 1 tablet by mouth 2 (two) times daily. Patient not taking: Reported on 09/29/2014 01/24/13   Charlestine Nighthristopher Lawyer, PA-C    Family History No family history on file.  Social History Social History  Substance Use Topics  . Smoking status: Never Smoker  . Smokeless tobacco: Never Used  . Alcohol use No     Allergies   Patient has no known allergies.   Review of Systems Review of Systems  Constitutional: Negative for chills and fever.  Respiratory: Negative for shortness of breath.   Gastrointestinal: Positive for abdominal pain (cramps).  Skin:  Positive for rash.  All other systems reviewed and are negative.    Physical Exam Updated Vital Signs BP 129/83 (BP Location: Left Arm)   Pulse 98   Temp 98.7 F (37.1 C) (Oral)   Resp 15   LMP 03/04/2016   SpO2 98%   Physical Exam  Constitutional: She appears well-developed and well-nourished.  HENT:  Head: Normocephalic and atraumatic.  Eyes: Conjunctivae are normal. Right eye exhibits no discharge. Left eye exhibits no discharge.  Cardiovascular:  Normal rate, regular rhythm and normal heart sounds.   Pulmonary/Chest: Effort normal and breath sounds normal. No respiratory distress.  Neurological: She is alert. Coordination normal.  Skin: Skin is warm and dry. Rash noted. She is not diaphoretic. No erythema.  Generalized pruritic, non erythematous rash on torso and legs.   Psychiatric: She has a normal mood and affect.  Nursing note and vitals reviewed.    ED Treatments / Results   DIAGNOSTIC STUDIES: Oxygen Saturation is 98% on RA, normal by my interpretation.    COORDINATION OF CARE: 11:17 PM Discussed treatment plan with pt at bedside which includes short course of steriods and pt agreed to plan.   Labs (all labs ordered are listed, but only abnormal results are displayed) Labs Reviewed - No data to display  EKG  EKG Interpretation None       Radiology No results found.  Procedures Procedures (including critical care time)  Medications Ordered in ED Medications - No data to display   Initial Impression / Assessment and Plan / ED Course  I have reviewed the triage vital signs and the nursing notes.  Pertinent labs & imaging results that were available during my care of the patient were reviewed by me and considered in my medical decision making (see chart for details).     Patient with nonspecific eruption. No signs of infection. Discharge with symptomatic treatment. Follow up with PCP in 2-3 days.     Final Clinical Impressions(s) / ED Diagnoses   Final diagnoses:  Pruritic rash    New Prescriptions Discharge Medication List as of 03/21/2016 11:25 PM    START taking these medications   Details  hydrOXYzine (ATARAX/VISTARIL) 25 MG tablet Take 1 tablet (25 mg total) by mouth every 6 (six) hours., Starting Thu 03/21/2016, Print        I personally performed the services described in this documentation, which was scribed in my presence. The recorded information has been reviewed and is  accurate.     Felicie Morn, NP 03/22/16 1610    Arby Barrette, MD 03/30/16 [phone]

## 2016-07-12 ENCOUNTER — Inpatient Hospital Stay (HOSPITAL_COMMUNITY): Payer: Medicaid Other

## 2016-07-12 ENCOUNTER — Encounter (HOSPITAL_COMMUNITY): Payer: Self-pay | Admitting: *Deleted

## 2016-07-12 ENCOUNTER — Inpatient Hospital Stay (HOSPITAL_COMMUNITY)
Admission: AD | Admit: 2016-07-12 | Discharge: 2016-07-12 | Disposition: A | Discharge status: Home / Self Care | Payer: Medicaid Other | Source: Ambulatory Visit | Attending: Obstetrics and Gynecology | Admitting: Obstetrics and Gynecology

## 2016-07-12 DIAGNOSIS — R109 Unspecified abdominal pain: Secondary | ICD-10-CM | POA: Insufficient documentation

## 2016-07-12 DIAGNOSIS — O9989 Other specified diseases and conditions complicating pregnancy, childbirth and the puerperium: Secondary | ICD-10-CM | POA: Diagnosis not present

## 2016-07-12 DIAGNOSIS — B9689 Other specified bacterial agents as the cause of diseases classified elsewhere: Secondary | ICD-10-CM | POA: Diagnosis not present

## 2016-07-12 DIAGNOSIS — Z3A01 Less than 8 weeks gestation of pregnancy: Secondary | ICD-10-CM | POA: Insufficient documentation

## 2016-07-12 DIAGNOSIS — O26891 Other specified pregnancy related conditions, first trimester: Secondary | ICD-10-CM | POA: Diagnosis not present

## 2016-07-12 DIAGNOSIS — N760 Acute vaginitis: Secondary | ICD-10-CM | POA: Insufficient documentation

## 2016-07-12 DIAGNOSIS — O209 Hemorrhage in early pregnancy, unspecified: Secondary | ICD-10-CM | POA: Insufficient documentation

## 2016-07-12 DIAGNOSIS — O3680X0 Pregnancy with inconclusive fetal viability, not applicable or unspecified: Secondary | ICD-10-CM

## 2016-07-12 DIAGNOSIS — O26899 Other specified pregnancy related conditions, unspecified trimester: Secondary | ICD-10-CM

## 2016-07-12 HISTORY — DX: Chlamydial infection, unspecified: A74.9

## 2016-07-12 HISTORY — DX: Headache: R51

## 2016-07-12 HISTORY — DX: Unspecified infectious disease: B99.9

## 2016-07-12 HISTORY — DX: Headache, unspecified: R51.9

## 2016-07-12 LAB — CBC
HCT: 37.8 % (ref 36.0–46.0)
HEMOGLOBIN: 12.9 g/dL (ref 12.0–15.0)
MCH: 28.7 pg (ref 26.0–34.0)
MCHC: 34.1 g/dL (ref 30.0–36.0)
MCV: 84 fL (ref 78.0–100.0)
PLATELETS: 248 10*3/uL (ref 150–400)
RBC: 4.5 MIL/uL (ref 3.87–5.11)
RDW: 13.9 % (ref 11.5–15.5)
WBC: 5.9 10*3/uL (ref 4.0–10.5)

## 2016-07-12 LAB — URINALYSIS, ROUTINE W REFLEX MICROSCOPIC
Bilirubin Urine: NEGATIVE
Glucose, UA: NEGATIVE mg/dL
Hgb urine dipstick: NEGATIVE
Ketones, ur: 20 mg/dL — AB
LEUKOCYTES UA: NEGATIVE
Nitrite: NEGATIVE
Protein, ur: NEGATIVE mg/dL
Specific Gravity, Urine: 1.025 (ref 1.005–1.030)
pH: 5 (ref 5.0–8.0)

## 2016-07-12 LAB — WET PREP, GENITAL
Sperm: NONE SEEN
TRICH WET PREP: NONE SEEN
Yeast Wet Prep HPF POC: NONE SEEN

## 2016-07-12 LAB — POCT PREGNANCY, URINE: PREG TEST UR: POSITIVE — AB

## 2016-07-12 LAB — HCG, QUANTITATIVE, PREGNANCY: HCG, BETA CHAIN, QUANT, S: 8628 m[IU]/mL — AB (ref <5)

## 2016-07-12 LAB — ABO/RH: ABO/RH(D): B POS

## 2016-07-12 MED ORDER — TERCONAZOLE 0.8 % VA CREA: 1.0000 | TOPICAL_CREAM | Freq: Every day | VAGINAL | 0 refills | Status: DC

## 2016-07-12 MED ORDER — METRONIDAZOLE 500 MG PO TABS
500.0000 mg | ORAL_TABLET | Freq: Two times a day (BID) | ORAL | 0 refills | Status: DC
Start: 2016-07-12 — End: 2017-03-19

## 2016-07-12 NOTE — Discharge Instructions (Signed)
Return to care   If you have heavier bleeding that soaks through more that 2 pads per hour for an hour or more  If you bleed so much that you feel like you might pass out or you do pass out  If you have significant abdominal pain that is not improved with Tylenol   If you develop a fever > 100.5     Bacterial Vaginosis Bacterial vaginosis is a vaginal infection that occurs when the normal balance of bacteria in the vagina is disrupted. It results from an overgrowth of certain bacteria. This is the most common vaginal infection among women ages 53-44. Because bacterial vaginosis increases your risk for STIs (sexually transmitted infections), getting treated can help reduce your risk for chlamydia, gonorrhea, herpes, and HIV (human immunodeficiency virus). Treatment is also important for preventing complications in pregnant women, because this condition can cause an early (premature) delivery. What are the causes? This condition is caused by an increase in harmful bacteria that are normally present in small amounts in the vagina. However, the reason that the condition develops is not fully understood. What increases the risk? The following factors may make you more likely to develop this condition:  Having a new sexual partner or multiple sexual partners.  Having unprotected sex.  Douching.  Having an intrauterine device (IUD).  Smoking.  Drug and alcohol abuse.  Taking certain antibiotic medicines.  Being pregnant.  You cannot get bacterial vaginosis from toilet seats, bedding, swimming pools, or contact with objects around you. What are the signs or symptoms? Symptoms of this condition include:  Grey or white vaginal discharge. The discharge can also be watery or foamy.  A fish-like odor with discharge, especially after sexual intercourse or during menstruation.  Itching in and around the vagina.  Burning or pain with urination.  Some women with bacterial vaginosis  have no signs or symptoms. How is this diagnosed? This condition is diagnosed based on:  Your medical history.  A physical exam of the vagina.  Testing a sample of vaginal fluid under a microscope to look for a large amount of bad bacteria or abnormal cells. Your health care provider may use a cotton swab or a small wooden spatula to collect the sample.  How is this treated? This condition is treated with antibiotics. These may be given as a pill, a vaginal cream, or a medicine that is put into the vagina (suppository). If the condition comes back after treatment, a second round of antibiotics may be needed. Follow these instructions at home: Medicines  Take over-the-counter and prescription medicines only as told by your health care provider.  Take or use your antibiotic as told by your health care provider. Do not stop taking or using the antibiotic even if you start to feel better. General instructions  If you have a female sexual partner, tell her that you have a vaginal infection. She should see her health care provider and be treated if she has symptoms. If you have a female sexual partner, he does not need treatment.  During treatment: ? Avoid sexual activity until you finish treatment. ? Do not douche. ? Avoid alcohol as directed by your health care provider. ? Avoid breastfeeding as directed by your health care provider.  Drink enough water and fluids to keep your urine clear or pale yellow.  Keep the area around your vagina and rectum clean. ? Wash the area daily with warm water. ? Wipe yourself from front to back after using the  toilet. °· Keep all follow-up visits as told by your health care provider. This is important. °How is this prevented? °· Do not douche. °· Wash the outside of your vagina with warm water only. °· Use protection when having sex. This includes latex condoms and dental dams. °· Limit how many sexual partners you have. To help prevent bacterial vaginosis,  it is best to have sex with just one partner (monogamous). °· Make sure you and your sexual partner are tested for STIs. °· Wear cotton or cotton-lined underwear. °· Avoid wearing tight pants and pantyhose, especially during summer. °· Limit the amount of alcohol that you drink. °· Do not use any products that contain nicotine or tobacco, such as cigarettes and e-cigarettes. If you need help quitting, ask your health care provider. °· Do not use illegal drugs. °Where to find more information: °· Centers for Disease Control and Prevention: www.cdc.gov/std °· American Sexual Health Association (ASHA): www.ashastd.org °· U.[place] Department of Health and Human Services, Office on Women's Health: www.womenshealth.gov/ or https://www.womenshealth.gov/a-z-topics/bacterial-vaginosis °Contact a health care provider if: °· Your symptoms do not improve, even after treatment. °· You have more discharge or pain when urinating. °· You have a fever. °· You have pain in your abdomen. °· You have pain during sex. °· You have vaginal bleeding between periods. °Summary °· Bacterial vaginosis is a vaginal infection that occurs when the normal balance of bacteria in the vagina is disrupted. °· Because bacterial vaginosis increases your risk for STIs (sexually transmitted infections), getting treated can help reduce your risk for chlamydia, gonorrhea, herpes, and HIV (human immunodeficiency virus). Treatment is also important for preventing complications in pregnant women, because the condition can cause an early (premature) delivery. °· This condition is treated with antibiotic medicines. These may be given as a pill, a vaginal cream, or a medicine that is put into the vagina (suppository). °This information is not intended to replace advice given to you by your health care provider. Make sure you discuss any questions you have with your health care provider. °Document Released: 01/14/2005 Document Revised: 09/30/2015 Document Reviewed:  09/30/2015 °[employer] Interactive Patient Education © 2017 [employer] ° °

## 2016-07-12 NOTE — MAU Provider Note (Signed)
History     CSN: [id number]  Arrival date and time: 07/12/16 1238  First Provider Initiated Contact with Patient 07/12/16 1357      Chief Complaint  Patient presents with  . Possible Pregnancy  . Abdominal Cramping  . Vaginal Bleeding   HPI Erin Harrison is a 21 y.o. G2P0010 at 5357w5d by LMP who presents with abdominal cramping. Symptoms began 2 weeks ago & worsened yesterday. Also noted pink spotting last week; no bleeding since. Rates pain 5/10. Has not treated. Denies n/v/d, constipation, dysuria, or vaginal discharge.   OB History    Gravida Para Term Preterm AB Living   2       1     SAB TAB Ectopic Multiple Live Births     1            Past Medical History:  Diagnosis Date  . Chlamydia   . Headache   . Hypoglycemia   . Infection    UTI    Past Surgical History:  Procedure Laterality Date  . WISDOM TOOTH EXTRACTION      Family History  Problem Relation Age of Onset  . Diabetes Father   . Stroke Father   . Cancer Maternal Grandmother        breast  . Diabetes Paternal Grandfather   . Hypertension Paternal Grandfather     Social History  Substance Use Topics  . Smoking status: Never Smoker  . Smokeless tobacco: Never Used  . Alcohol use Yes     Comment: occ    Allergies: No Known Allergies  Prescriptions Prior to Admission  Medication Sig Dispense Refill Last Dose  . diphenhydrAMINE (BENADRYL) 25 MG tablet Take 1 tablet (25 mg total) by mouth every 6 (six) hours as needed for itching (Rash). 30 tablet 0 Unknown at Unknown time  . diphenhydrAMINE (SOMINEX) 25 MG tablet Take 25 mg by mouth daily as needed (rash).   Unknown at Unknown time  . famotidine (PEPCID) 20 MG tablet Take 1 tablet (20 mg total) by mouth 2 (two) times daily. (Patient not taking: Reported on 09/29/2014) 10 tablet 0 Unknown at Unknown time  . guaiFENesin (MUCINEX) 600 MG 12 hr tablet Take 1 tablet (600 mg total) by mouth 2 (two) times daily. 20 tablet 0   . hydrocortisone 2.5 %  lotion Apply topically 2 (two) times daily. 59 mL 0 Unknown at Unknown time  . hydrOXYzine (ATARAX/VISTARIL) 25 MG tablet Take 1 tablet (25 mg total) by mouth every 6 (six) hours. 12 tablet 0   . naproxen sodium (ANAPROX) 220 MG tablet Take 220 mg by mouth once.   Unknown at Unknown time  . Norgestim-Eth Estrad Triphasic (ORTHO TRI-CYCLEN, 28, PO) Take 1 tablet by mouth daily.   Unknown at Unknown time  . predniSONE (DELTASONE) 20 MG tablet 3 tabs po day one, then 2 tabs daily x 4 days 11 tablet 0   . promethazine (PHENERGAN) 25 MG tablet Take 1 tablet (25 mg total) by mouth every 8 (eight) hours as needed for nausea or vomiting. 10 tablet 0 Unknown at Unknown time  . sulfamethoxazole-trimethoprim (SEPTRA DS) 800-160 MG per tablet Take 1 tablet by mouth 2 (two) times daily. (Patient not taking: Reported on 09/29/2014) 6 tablet 0 Unknown at Unknown time    Review of Systems  Constitutional: Negative.   Gastrointestinal: Positive for abdominal pain. Negative for constipation, diarrhea, nausea and vomiting.  Genitourinary: Negative.  Negative for dysuria, vaginal bleeding and vaginal discharge.  Physical Exam   Blood pressure 118/68, pulse 80, temperature 98.6 F (37 C), temperature source Oral, resp. rate 16, weight 159 lb 8 oz (72.3 kg), last menstrual period 06/09/2016, SpO2 100 %.  Physical Exam  Nursing note and vitals reviewed. Constitutional: She is oriented to person, place, and time. She appears well-developed and well-nourished. No distress.  HENT:  Head: Normocephalic and atraumatic.  Eyes: Conjunctivae are normal. Right eye exhibits no discharge. Left eye exhibits no discharge. No scleral icterus.  Neck: Normal range of motion.  Cardiovascular: Normal rate, regular rhythm and normal heart sounds.   No murmur heard. Respiratory: Effort normal and breath sounds normal. No respiratory distress. She has no wheezes.  GI: Soft. Bowel sounds are normal. She exhibits no distension. There  is no tenderness. There is no rebound and no guarding.  Neurological: She is alert and oriented to person, place, and time.  Skin: Skin is warm and dry. She is not diaphoretic.  Psychiatric: She has a normal mood and affect. Her behavior is normal. Judgment and thought content normal.    MAU Course  Procedures Results for orders placed or performed during the hospital encounter of 07/12/16 (from the past 24 hour(s))  Urinalysis, Routine w reflex microscopic     Status: Abnormal   Collection Time: 07/12/16  1:24 PM  Result Value Ref Range   Color, Urine YELLOW YELLOW   APPearance CLOUDY (A) CLEAR   Specific Gravity, Urine 1.025 1.005 - 1.030   pH 5.0 5.0 - 8.0   Glucose, UA NEGATIVE NEGATIVE mg/dL   Hgb urine dipstick NEGATIVE NEGATIVE   Bilirubin Urine NEGATIVE NEGATIVE   Ketones, ur 20 (A) NEGATIVE mg/dL   Protein, ur NEGATIVE NEGATIVE mg/dL   Nitrite NEGATIVE NEGATIVE   Leukocytes, UA NEGATIVE NEGATIVE  Pregnancy, urine POC     Status: Abnormal   Collection Time: 07/12/16  1:35 PM  Result Value Ref Range   Preg Test, Ur POSITIVE (A) NEGATIVE  Wet prep, genital     Status: Abnormal   Collection Time: 07/12/16  1:47 PM  Result Value Ref Range   Yeast Wet Prep HPF POC NONE SEEN NONE SEEN   Trich, Wet Prep NONE SEEN NONE SEEN   Clue Cells Wet Prep HPF POC PRESENT (A) NONE SEEN   WBC, Wet Prep HPF POC FEW (A) NONE SEEN   Sperm NONE SEEN   CBC     Status: None   Collection Time: 07/12/16  2:00 PM  Result Value Ref Range   WBC 5.9 4.0 - 10.5 K/uL   RBC 4.50 3.87 - 5.11 MIL/uL   Hemoglobin 12.9 12.0 - 15.0 g/dL   HCT 56.2 13.0 - 86.5 %   MCV 84.0 78.0 - 100.0 fL   MCH 28.7 26.0 - 34.0 pg   MCHC 34.1 30.0 - 36.0 g/dL   RDW 78.4 69.6 - 29.5 %   Platelets 248 150 - 400 K/uL  ABO/Rh     Status: None (Preliminary result)   Collection Time: 07/12/16  2:00 PM  Result Value Ref Range   ABO/RH(D) B POS   hCG, quantitative, pregnancy     Status: Abnormal   Collection Time:  07/12/16  2:00 PM  Result Value Ref Range   hCG, Beta Chain, Quant, S 8,628 (H) <5 mIU/mL   US Ob Comp Less 14 Wks  Result Date: 07/12/2016 CLINICAL DATA:  Abdominal cramping affecting pregnancy EXAM: OBSTETRIC <14 WK [place] AND TRANSVAGINAL OB US TECHNIQUE: Both transabdominal and transvaginal ultrasound  examinations were performed for complete evaluation of the gestation as well as the maternal uterus, adnexal regions, and pelvic cul-de-sac. Transvaginal technique was performed to assess early pregnancy. COMPARISON:  None. FINDINGS: Intrauterine gestational sac: Single Yolk sac:  Not Visualized. Embryo:  Not Visualized. MSD: 7.8   mm   5 w   3  d Subchorionic hemorrhage:  Moderate subchorionic hemorrhage. Maternal uterus/adnexae: Right ovary: 3.2 x 2.2 x 2.4 cm Left ovary: 2.0 x 1.4 x 1.5 cm Other :None Free fluid:  None IMPRESSION: 1. Probable early intrauterine gestational sac, but no yolk sac, fetal pole, or cardiac activity yet visualized. Recommend follow-up quantitative B-HCG levels and follow-up US in 14 days to assess viability. This recommendation follows SRU consensus guidelines: Diagnostic Criteria for Nonviable Pregnancy Early in the First Trimester. Malva Limes Med 2013; 960:4540-98. 2. Moderate subchorionic hemorrhage Electronically Signed   By: Signa Kell M.D.   On: 07/12/2016 16:00   US Ob Transvaginal  Result Date: 07/12/2016 CLINICAL DATA:  Abdominal cramping affecting pregnancy EXAM: OBSTETRIC <14 WK [place] AND TRANSVAGINAL OB US TECHNIQUE: Both transabdominal and transvaginal ultrasound examinations were performed for complete evaluation of the gestation as well as the maternal uterus, adnexal regions, and pelvic cul-de-sac. Transvaginal technique was performed to assess early pregnancy. COMPARISON:  None. FINDINGS: Intrauterine gestational sac: Single Yolk sac:  Not Visualized. Embryo:  Not Visualized. MSD: 7.8   mm   5 w   3  d Subchorionic hemorrhage:  Moderate subchorionic hemorrhage.  Maternal uterus/adnexae: Right ovary: 3.2 x 2.2 x 2.4 cm Left ovary: 2.0 x 1.4 x 1.5 cm Other :None Free fluid:  None IMPRESSION: 1. Probable early intrauterine gestational sac, but no yolk sac, fetal pole, or cardiac activity yet visualized. Recommend follow-up quantitative B-HCG levels and follow-up US in 14 days to assess viability. This recommendation follows SRU consensus guidelines: Diagnostic Criteria for Nonviable Pregnancy Early in the First Trimester. Malva Limes Med 2013; 119:1478-29. 2. Moderate subchorionic hemorrhage Electronically Signed   By: Signa Kell M.D.   On: 07/12/2016 16:00    MDM +UPT UA, wet prep, GC/chlamydia, CBC, ABO/Rh, quant hCG, HIV, and [place] today to rule out ectopic pregnancy B positive Ultrasound shows IUGS, no yolk sac This abdominal pain could represent a normal pregnancy, spontaneous abortion, or even an ectopic pregnancy which can be life-threatening. Cultures were obtained to rule out pelvic infection.  VSS, hemoglobin stable, NAD -- will bring pt back for repeat BHCG Assessment and Plan  A: 1. BV (bacterial vaginosis)   2. Abdominal cramping affecting pregnancy   3. Pregnancy of unknown anatomic location    P: P: Discharge home Rx flagyl Discussed reasons to return to MAU Go to [facility] Monday morning for repeat BHCG  GC/CT pending   Judeth Horn 07/12/2016, 1:57 PM

## 2016-07-12 NOTE — Progress Notes (Signed)
Wet prep and GC done

## 2016-07-12 NOTE — MAU Note (Signed)
Having a lot of cramping,is so painful it will wake her out of her sleep.has been going on for 2 wks, past 2-3 days has gotten really bad.  Had a little spotting last wk. +HPT last wk

## 2016-07-13 LAB — HIV ANTIBODY (ROUTINE TESTING W REFLEX): HIV SCREEN 4TH GENERATION: NONREACTIVE

## 2016-07-15 ENCOUNTER — Ambulatory Visit: Payer: Medicaid Other | Admitting: *Deleted

## 2016-07-15 DIAGNOSIS — O3680X0 Pregnancy with inconclusive fetal viability, not applicable or unspecified: Secondary | ICD-10-CM

## 2016-07-15 LAB — OB RESULTS CONSOLE GC/CHLAMYDIA: Gonorrhea: NEGATIVE

## 2016-07-15 LAB — OB RESULTS CONSOLE HEPATITIS B SURFACE ANTIGEN: HEP B S AG: NEGATIVE

## 2016-07-15 LAB — GC/CHLAMYDIA PROBE AMP (~~LOC~~) NOT AT ARMC
CHLAMYDIA, DNA PROBE: NEGATIVE
Neisseria Gonorrhea: NEGATIVE

## 2016-07-15 LAB — HCG, QUANTITATIVE, PREGNANCY: hCG, Beta Chain, Quant, S: 19808 m[IU]/mL — ABNORMAL HIGH (ref <5)

## 2016-07-15 LAB — OB RESULTS CONSOLE HIV ANTIBODY (ROUTINE TESTING): HIV: NONREACTIVE

## 2016-07-15 LAB — OB RESULTS CONSOLE RUBELLA ANTIBODY, IGM: RUBELLA: IMMUNE

## 2016-07-15 NOTE — Progress Notes (Signed)
Patient bhcg more than doubled. Consulted with Dr Debroah LoopArnold who recommended a f/u u/s in one week. Spoke with patient about results and recommendations. Scheduled u/s for 6/25 @ 2pm. Patient voiced understanding.

## 2016-07-15 NOTE — Progress Notes (Signed)
Pt here today for STAT beta draw for pregnancy unknown location.  Pt is aware of reason for lab visit and the reason to stay for up to hours today.  Pt denies any bleeding or pain today.  Pt informed to wait in waiting room until the results.  Pt stated understanding with no further questions.

## 2016-07-22 ENCOUNTER — Ambulatory Visit (HOSPITAL_COMMUNITY)
Admission: RE | Admit: 2016-07-22 | Discharge: 2016-07-22 | Disposition: A | Discharge status: Home / Self Care | Payer: Medicaid Other | Source: Ambulatory Visit | Attending: Obstetrics & Gynecology | Admitting: Obstetrics & Gynecology

## 2016-07-22 ENCOUNTER — Other Ambulatory Visit: Payer: Self-pay | Admitting: Obstetrics & Gynecology

## 2016-07-22 DIAGNOSIS — O468X1 Other antepartum hemorrhage, first trimester: Secondary | ICD-10-CM | POA: Diagnosis not present

## 2016-07-22 DIAGNOSIS — O3680X0 Pregnancy with inconclusive fetal viability, not applicable or unspecified: Secondary | ICD-10-CM

## 2016-07-22 DIAGNOSIS — Z3A01 Less than 8 weeks gestation of pregnancy: Secondary | ICD-10-CM | POA: Insufficient documentation

## 2016-07-22 DIAGNOSIS — Z3490 Encounter for supervision of normal pregnancy, unspecified, unspecified trimester: Secondary | ICD-10-CM | POA: Diagnosis present

## 2016-08-20 ENCOUNTER — Other Ambulatory Visit: Payer: Self-pay | Admitting: Student

## 2017-01-28 NOTE — L&D Delivery Note (Signed)
Delivery Note Patient progressed rapidly to 10/10/+2.  She pushed well for 15 minutes. At 8:40 PM a viable female was delivered via Vaginal, Spontaneous (Presentation: OA ).  APGAR: 8, 9; weight pending.  Placenta status: Spontaneous, in tact .  Cord: 3V nuchal cord with the following complications: None .  Cord pH: n/a  Anesthesia:  Epidural Episiotomy: None Lacerations: 1st degree ;Labial Suture Repair: 3.0 vicryl rapide Est. Blood Loss (mL):  400 mL  Mom to postpartum.  Baby to Couplet care / Skin to Skin.  Erin Harrison Erin Harrison 03/19/2017, 9:01 PM

## 2017-02-13 LAB — OB RESULTS CONSOLE GBS: GBS: POSITIVE

## 2017-03-19 ENCOUNTER — Inpatient Hospital Stay (HOSPITAL_COMMUNITY): Payer: Medicaid Other | Admitting: Anesthesiology

## 2017-03-19 ENCOUNTER — Other Ambulatory Visit: Payer: Self-pay

## 2017-03-19 ENCOUNTER — Inpatient Hospital Stay (HOSPITAL_COMMUNITY)
Admission: AD | Admit: 2017-03-19 | Discharge: 2017-03-21 | DRG: 807 | Disposition: A | Discharge status: Home / Self Care | Payer: Medicaid Other | Source: Ambulatory Visit | Attending: Obstetrics | Admitting: Obstetrics

## 2017-03-19 ENCOUNTER — Encounter (HOSPITAL_COMMUNITY): Payer: Self-pay | Admitting: *Deleted

## 2017-03-19 DIAGNOSIS — O9902 Anemia complicating childbirth: Secondary | ICD-10-CM | POA: Diagnosis present

## 2017-03-19 DIAGNOSIS — D649 Anemia, unspecified: Secondary | ICD-10-CM | POA: Diagnosis present

## 2017-03-19 DIAGNOSIS — O99824 Streptococcus B carrier state complicating childbirth: Secondary | ICD-10-CM | POA: Diagnosis present

## 2017-03-19 DIAGNOSIS — Z3A40 40 weeks gestation of pregnancy: Secondary | ICD-10-CM | POA: Diagnosis not present

## 2017-03-19 DIAGNOSIS — Z3483 Encounter for supervision of other normal pregnancy, third trimester: Secondary | ICD-10-CM | POA: Diagnosis present

## 2017-03-19 LAB — CBC
HCT: 31.8 % — ABNORMAL LOW (ref 36.0–46.0)
Hemoglobin: 10.3 g/dL — ABNORMAL LOW (ref 12.0–15.0)
MCH: 25 pg — ABNORMAL LOW (ref 26.0–34.0)
MCHC: 32.4 g/dL (ref 30.0–36.0)
MCV: 77.2 fL — AB (ref 78.0–100.0)
PLATELETS: 165 10*3/uL (ref 150–400)
RBC: 4.12 MIL/uL (ref 3.87–5.11)
RDW: 14.5 % (ref 11.5–15.5)
WBC: 9.1 10*3/uL (ref 4.0–10.5)

## 2017-03-19 LAB — TYPE AND SCREEN
ABO/RH(D): B POS
ANTIBODY SCREEN: NEGATIVE

## 2017-03-19 MED ORDER — LACTATED RINGERS IV SOLN
INTRAVENOUS | Status: DC
  Administered 2017-03-19: 11:00:00 via INTRAVENOUS
  Administered 2017-03-19: 500 mL via INTRAVENOUS

## 2017-03-19 MED ORDER — OXYTOCIN 40 UNITS IN LACTATED RINGERS INFUSION - SIMPLE MED
1.0000 m[IU]/min | INTRAVENOUS | Status: DC
  Administered 2017-03-19: 2 m[IU]/min via INTRAVENOUS
  Filled 2017-03-19: qty 1000

## 2017-03-19 MED ORDER — SIMETHICONE 80 MG PO CHEW: 80.0000 mg | CHEWABLE_TABLET | ORAL | Status: DC | PRN

## 2017-03-19 MED ORDER — OXYCODONE HCL 5 MG PO TABS: 5.0000 mg | ORAL_TABLET | ORAL | Status: DC | PRN

## 2017-03-19 MED ORDER — OXYCODONE-ACETAMINOPHEN 5-325 MG PO TABS: 1.0000 | ORAL_TABLET | ORAL | Status: DC | PRN

## 2017-03-19 MED ORDER — PRENATAL MULTIVITAMIN CH
1.0000 | ORAL_TABLET | Freq: Every day | ORAL | Status: DC
  Administered 2017-03-21: 1 via ORAL
  Filled 2017-03-19: qty 1

## 2017-03-19 MED ORDER — ACETAMINOPHEN 325 MG PO TABS: 650.0000 mg | ORAL_TABLET | ORAL | Status: DC | PRN

## 2017-03-19 MED ORDER — TERBUTALINE SULFATE 1 MG/ML IJ SOLN
0.2500 mg | Freq: Once | INTRAMUSCULAR | Status: DC | PRN
  Filled 2017-03-19: qty 1

## 2017-03-19 MED ORDER — OXYCODONE-ACETAMINOPHEN 5-325 MG PO TABS: 2.0000 | ORAL_TABLET | ORAL | Status: DC | PRN

## 2017-03-19 MED ORDER — SODIUM CHLORIDE 0.9 % IV SOLN
5.0000 10*6.[IU] | Freq: Once | INTRAVENOUS | Status: AC
  Administered 2017-03-19: 5 10*6.[IU] via INTRAVENOUS
  Filled 2017-03-19: qty 5

## 2017-03-19 MED ORDER — OXYTOCIN 40 UNITS IN LACTATED RINGERS INFUSION - SIMPLE MED: 2.5000 [IU]/h | INTRAVENOUS | Status: DC

## 2017-03-19 MED ORDER — DIBUCAINE 1 % RE OINT: 1.0000 "application " | TOPICAL_OINTMENT | RECTAL | Status: DC | PRN

## 2017-03-19 MED ORDER — PENICILLIN G POT IN DEXTROSE 60000 UNIT/ML IV SOLN
3.0000 10*6.[IU] | INTRAVENOUS | Status: DC
  Administered 2017-03-19: 3 10*6.[IU] via INTRAVENOUS
  Filled 2017-03-19 (×2): qty 50

## 2017-03-19 MED ORDER — FENTANYL 2.5 MCG/ML BUPIVACAINE 1/10 % EPIDURAL INFUSION (WH - ANES)
14.0000 mL/h | INTRAMUSCULAR | Status: DC | PRN
  Administered 2017-03-19: 14 mL/h via EPIDURAL
  Filled 2017-03-19: qty 100

## 2017-03-19 MED ORDER — LACTATED RINGERS IV SOLN: 500.0000 mL | INTRAVENOUS | Status: DC | PRN

## 2017-03-19 MED ORDER — BENZOCAINE-MENTHOL 20-0.5 % EX AERO
1.0000 "application " | INHALATION_SPRAY | CUTANEOUS | Status: DC | PRN
  Administered 2017-03-20: 1 via TOPICAL
  Filled 2017-03-19: qty 56

## 2017-03-19 MED ORDER — IBUPROFEN 600 MG PO TABS
600.0000 mg | ORAL_TABLET | Freq: Four times a day (QID) | ORAL | Status: DC
  Administered 2017-03-19 – 2017-03-21 (×6): 600 mg via ORAL
  Filled 2017-03-19 (×7): qty 1

## 2017-03-19 MED ORDER — ONDANSETRON HCL 4 MG PO TABS
4.0000 mg | ORAL_TABLET | ORAL | Status: DC | PRN
Start: 2017-03-19 — End: 2017-03-21

## 2017-03-19 MED ORDER — LACTATED RINGERS IV SOLN: 500.0000 mL | Freq: Once | INTRAVENOUS | Status: DC

## 2017-03-19 MED ORDER — LIDOCAINE HCL (PF) 1 % IJ SOLN
INTRAMUSCULAR | Status: DC | PRN
  Administered 2017-03-19 (×2): 5 mL via EPIDURAL

## 2017-03-19 MED ORDER — DIPHENHYDRAMINE HCL 25 MG PO CAPS: 25.0000 mg | ORAL_CAPSULE | Freq: Four times a day (QID) | ORAL | Status: DC | PRN

## 2017-03-19 MED ORDER — PHENYLEPHRINE 40 MCG/ML (10ML) SYRINGE FOR IV PUSH (FOR BLOOD PRESSURE SUPPORT)
80.0000 ug | PREFILLED_SYRINGE | INTRAVENOUS | Status: DC | PRN
  Filled 2017-03-19: qty 5

## 2017-03-19 MED ORDER — PHENYLEPHRINE 40 MCG/ML (10ML) SYRINGE FOR IV PUSH (FOR BLOOD PRESSURE SUPPORT)
80.0000 ug | PREFILLED_SYRINGE | INTRAVENOUS | Status: DC | PRN
  Filled 2017-03-19: qty 5
  Filled 2017-03-19: qty 10

## 2017-03-19 MED ORDER — ONDANSETRON HCL 4 MG/2ML IJ SOLN: 4.0000 mg | INTRAMUSCULAR | Status: DC | PRN

## 2017-03-19 MED ORDER — SENNOSIDES-DOCUSATE SODIUM 8.6-50 MG PO TABS
2.0000 | ORAL_TABLET | ORAL | Status: DC
  Administered 2017-03-19 – 2017-03-20 (×2): 2 via ORAL
  Filled 2017-03-19 (×2): qty 2

## 2017-03-19 MED ORDER — EPHEDRINE 5 MG/ML INJ
10.0000 mg | INTRAVENOUS | Status: DC | PRN
  Filled 2017-03-19: qty 2

## 2017-03-19 MED ORDER — WITCH HAZEL-GLYCERIN EX PADS: 1.0000 "application " | MEDICATED_PAD | CUTANEOUS | Status: DC | PRN

## 2017-03-19 MED ORDER — TETANUS-DIPHTH-ACELL PERTUSSIS 5-2.5-18.5 LF-MCG/0.5 IM SUSP: 0.5000 mL | Freq: Once | INTRAMUSCULAR | Status: DC

## 2017-03-19 MED ORDER — OXYTOCIN BOLUS FROM INFUSION
500.0000 mL | Freq: Once | INTRAVENOUS | Status: AC
  Administered 2017-03-19: 500 mL via INTRAVENOUS

## 2017-03-19 MED ORDER — OXYCODONE HCL 5 MG PO TABS: 10.0000 mg | ORAL_TABLET | ORAL | Status: DC | PRN

## 2017-03-19 MED ORDER — ONDANSETRON HCL 4 MG/2ML IJ SOLN
4.0000 mg | Freq: Four times a day (QID) | INTRAMUSCULAR | Status: DC | PRN
  Administered 2017-03-19: 4 mg via INTRAVENOUS
  Filled 2017-03-19: qty 2

## 2017-03-19 MED ORDER — COCONUT OIL OIL
1.0000 "application " | TOPICAL_OIL | Status: DC | PRN
  Administered 2017-03-20: 1 via TOPICAL
  Filled 2017-03-19: qty 120

## 2017-03-19 MED ORDER — FENTANYL CITRATE (PF) 100 MCG/2ML IJ SOLN
50.0000 ug | INTRAMUSCULAR | Status: DC | PRN
  Administered 2017-03-19 (×3): 100 ug via INTRAVENOUS
  Filled 2017-03-19 (×3): qty 2

## 2017-03-19 MED ORDER — LIDOCAINE HCL (PF) 1 % IJ SOLN
30.0000 mL | INTRAMUSCULAR | Status: DC | PRN
  Filled 2017-03-19: qty 30

## 2017-03-19 MED ORDER — DIPHENHYDRAMINE HCL 50 MG/ML IJ SOLN: 12.5000 mg | INTRAMUSCULAR | Status: DC | PRN

## 2017-03-19 MED ORDER — SOD CITRATE-CITRIC ACID 500-334 MG/5ML PO SOLN
30.0000 mL | ORAL | Status: DC | PRN
Start: 2017-03-19 — End: 2017-03-19

## 2017-03-19 NOTE — H&P (Signed)
22 y.o. G2P0010 @ 6737w3d presents with painful contractions.  Was 1 cm in office yesterday and is 3 cm on exam today.  Otherwise has good fetal movement and no bleeding. She required multiple doses of IV pain meds in MAU while waiting for a bed on L&D  Pregnancy c/b: 1. History of chlamydia: Diagnosed on 1/22 and treated with azithromycin, repeat testing on 1/29 was still positive and she was treated on again 03/01/17 with azithromycin (though 7d between tests was likely not enough time to clear residual DNA to result in negative test of cure)  Past Medical History:  Diagnosis Date  . Chlamydia   . Headache   . Hypoglycemia   . Infection    UTI    Past Surgical History:  Procedure Laterality Date  . WISDOM TOOTH EXTRACTION      OB History  Gravida Para Term Preterm AB Living  2       1    SAB TAB Ectopic Multiple Live Births    1          # Outcome Date GA Lbr Len/2nd Weight Sex Delivery Anes PTL Lv  2 Current           1 TAB 2017              Social History   Socioeconomic History  . Marital status: Single    Spouse name: Not on file  . Number of children: Not on file  . Years of education: Not on file  . Highest education level: Not on file  Tobacco Use  . Smoking status: Never Smoker  . Smokeless tobacco: Never Used  Substance and Sexual Activity  . Alcohol use: Yes    Comment: occ  . Drug use: No  . Sexual activity: Yes    Birth control/protection: None  Other Topics Concern  . Not on file  Social History Narrative  . Not on file   Patient has no known allergies.    Prenatal Transfer Tool  Maternal Diabetes: No Genetic Screening: Normal FTS Maternal Ultrasounds/Referrals: Abnormal:  Findings:   Isolated EIF (echogenic intracardiac focus) Fetal Ultrasounds or other Referrals:  None Maternal Substance Abuse:  No Significant Maternal Medications:  None Significant Maternal Lab Results: Lab values include: Group B Strep positive  ABO, Rh: --/--/B POS (02/20  1120) Antibody: NEG (02/20 1120) Rubella: Immune (06/18 0000) RPR:   Neg HBsAg: Negative (06/18 0000)  HIV: Non-reactive (06/18 0000)  GBS: Positive (01/17 0000)        Vitals:   03/19/17 1931 03/19/17 2000  BP: 109/71 106/82  Pulse: 79 81  Resp: 18 16  Temp: 98 F (36.7 C)   SpO2:       General:  NAD Abdomen:  soft, gravid, EFW 6.5# Ex:  no edema SVE:  3-4/80/-2 per RN FHTs:  120s, mod var, + accels Toco:  q3-5 min  EFW: 37 weeks: 2817gm ( 27%)  A/P   22 y.o. G2P0010 7737w3d presents with early labor Admit to L&D Epidural now Start pitocin for labor augmentation  FSR/ vtx/ GBS positive--pcn  [facility] HospitalDYANNA GEFFEL [employer] Tim[employer] CompanyCLARK

## 2017-03-19 NOTE — MAU Note (Signed)
Contractions started at 0430, now every 3 min.  No bleeding or leaking. Was 2 cm when last checked, membranes stripped

## 2017-03-19 NOTE — Anesthesia Procedure Notes (Signed)
Epidural Patient location during procedure: OB Start time: 03/19/2017 5:10 PM  Staffing Anesthesiologist: Leonides GrillsEllender, Ryan P, MD Performed: anesthesiologist   Preanesthetic Checklist Completed: patient identified, site marked, pre-op evaluation, timeout performed, IV checked, risks and benefits discussed and monitors and equipment checked  Epidural Patient position: sitting Prep: DuraPrep Patient monitoring: heart rate, cardiac monitor, continuous pulse ox and blood pressure Approach: midline Location: L4-L5 Injection technique: LOR air  Needle:  Needle type: Tuohy  Needle gauge: 17 G Needle length: 9 cm Needle insertion depth: 6 cm Catheter type: closed end flexible Catheter size: 19 Gauge Catheter at skin depth: 11 cm Test dose: negative and Other  Assessment Events: blood not aspirated, injection not painful, no injection resistance and negative IV test  Additional Notes Informed consent obtained prior to proceeding including risk of failure, 1% risk of PDPH, risk of minor discomfort and bruising. Discussed alternatives to epidural analgesia and patient desires to proceed.  Timeout performed pre-procedure verifying patient name, procedure, and platelet count.  Patient tolerated procedure well. Reason for block:procedure for pain

## 2017-03-19 NOTE — MAU Note (Signed)
Pt presents with c/o regular ctxs since 0430 this morning, reports ctxs are 2 minutes apart.  Denies VB or LOF.  Reports +FM.

## 2017-03-19 NOTE — Anesthesia Preprocedure Evaluation (Signed)
Anesthesia Evaluation  Patient identified by MRN, date of birth, ID band Patient awake    Reviewed: Allergy & Precautions, H&P , NPO status , Patient's Chart, lab work & pertinent test results  History of Anesthesia Complications Negative for: history of anesthetic complications  Airway Mallampati: II  TM Distance: >3 FB Neck ROM: full    Dental no notable dental hx. (+) Teeth Intact   Pulmonary neg pulmonary ROS,    Pulmonary exam normal breath sounds clear to auscultation       Cardiovascular negative cardio ROS Normal cardiovascular exam Rhythm:regular Rate:Normal     Neuro/Psych  Headaches, negative psych ROS   GI/Hepatic negative GI ROS, Neg liver ROS,   Endo/Other  negative endocrine ROS  Renal/GU negative Renal ROS  negative genitourinary   Musculoskeletal   Abdominal   Peds  Hematology  (+) anemia ,   Anesthesia Other Findings   Reproductive/Obstetrics (+) Pregnancy                             Anesthesia Physical Anesthesia Plan  ASA: II  Anesthesia Plan: Epidural   Post-op Pain Management:    Induction:   PONV Risk Score and Plan:   Airway Management Planned:   Additional Equipment:   Intra-op Plan:   Post-operative Plan:   Informed Consent: I have reviewed the patients History and Physical, chart, labs and discussed the procedure including the risks, benefits and alternatives for the proposed anesthesia with the patient or authorized representative who has indicated his/her understanding and acceptance.     Plan Discussed with:   Anesthesia Plan Comments:         Anesthesia Quick Evaluation

## 2017-03-20 LAB — CBC
HEMATOCRIT: 27.8 % — AB (ref 36.0–46.0)
HEMOGLOBIN: 9.1 g/dL — AB (ref 12.0–15.0)
MCH: 25 pg — ABNORMAL LOW (ref 26.0–34.0)
MCHC: 32.7 g/dL (ref 30.0–36.0)
MCV: 76.4 fL — ABNORMAL LOW (ref 78.0–100.0)
Platelets: 158 10*3/uL (ref 150–400)
RBC: 3.64 MIL/uL — ABNORMAL LOW (ref 3.87–5.11)
RDW: 14.5 % (ref 11.5–15.5)
WBC: 12.7 10*3/uL — AB (ref 4.0–10.5)

## 2017-03-20 LAB — RPR: RPR Ser Ql: NONREACTIVE

## 2017-03-20 NOTE — Progress Notes (Signed)
Patient is eating, ambulating, voiding.  Pain control is good.  Vitals:   03/19/17 2200 03/19/17 2219 03/19/17 2335 03/20/17 0401  BP: 102/75 (!) 108/58 105/64 111/70  Pulse: 100 74 84 63  Resp: 17   16  Temp:  98.1 F (36.7 C) 98.2 F (36.8 C) 97.8 F (36.6 C)  TempSrc:  Oral Oral Oral  SpO2:      Weight:      Height:        Fundus firm Perineum without swelling.  Lab Results  Component Value Date   WBC 12.7 (H) 03/20/2017   HGB 9.1 (L) 03/20/2017   HCT 27.8 (L) 03/20/2017   MCV 76.4 (L) 03/20/2017   PLT 158 03/20/2017    --/--/B POS (02/20 1120)/RI  A/P Post partum day I.  Routine care.  Expect d/c routine.    Erin Harrison A

## 2017-03-20 NOTE — Lactation Note (Signed)
This note was copied from a baby's chart. Lactation Consultation Note; Baby now 7614 hours old. Mom reports much pain with latch but she wants to breast feed. Has had a couple of bottles of formula but spit last one up. Baby waking in bassinet. Offered assist with latch and mom agreeable. Does not want to latch to right nipple because it is sore. Used football hold. Baby did some biting on my gloved finger but then got into a good sucking pattern before latching. Mom easily able to hand express Colostrum. Mom reports pain level of 7-8 with latch that then eased off and mom reports it was much better than the other times. BF brochure given. Reviewed our phone number, OP appointments and [facility] as resources for support after DC. No questions at present. Encouragement given.   Patient Name: Girl Dorcas CarrowLeshaun Toops [id number]'s Date: 03/20/2017 Reason for consult: Initial assessment   Maternal Data Formula Feeding for Exclusion: Yes Reason for exclusion: Mother's choice to formula and breast feed on admission Has patient been taught Hand Expression?: Yes Does the patient have breastfeeding experience prior to this delivery?: No  Feeding Feeding Type: Breast Fed Nipple Type: Slow - flow Length of feed: 15 min  LATCH Score Latch: Grasps breast easily, tongue down, lips flanged, rhythmical sucking.  Audible Swallowing: A few with stimulation  Type of Nipple: Everted at rest and after stimulation  Comfort (Breast/Nipple): Filling, red/small blisters or bruises, mild/mod discomfort  Hold (Positioning): Assistance needed to correctly position infant at breast and maintain latch.  LATCH Score: 7  Interventions Interventions: Breast feeding basics reviewed;Assisted with latch;Skin to skin;Hand express;Breast compression;Expressed milk  Lactation Tools Discussed/Used     Consult Status Consult Status: Follow-up Date: 03/21/17 Follow-up type: In-patient    Pamelia HoitWeeks, Sherran Margolis D 03/20/2017, 11:15  AM

## 2017-03-20 NOTE — Anesthesia Postprocedure Evaluation (Signed)
Anesthesia Post Note  Patient: Erin Harrison  Procedure(s) Performed: AN AD HOC LABOR EPIDURAL     Patient location during evaluation: Mother Baby Anesthesia Type: Epidural Level of consciousness: awake and alert Pain management: pain level controlled Vital Signs Assessment: post-procedure vital signs reviewed and stable Respiratory status: spontaneous breathing, nonlabored ventilation and respiratory function stable Cardiovascular status: stable Postop Assessment: no headache, no backache, epidural receding, adequate PO intake, no apparent nausea or vomiting and patient able to bend at knees Anesthetic complications: no    Last Vitals:  Vitals:   03/19/17 2335 03/20/17 0401  BP: 105/64 111/70  Pulse: 84 63  Resp:  16  Temp: 36.8 C 36.6 C  SpO2:      Last Pain:  Vitals:   03/20/17 0537  TempSrc:   PainSc: 0-No pain   Pain Goal:                 Land O'LakesMalinova,Emberli Ballester Hristova

## 2017-03-21 MED ORDER — OXYCODONE-ACETAMINOPHEN 5-325 MG PO TABS: 1.0000 | ORAL_TABLET | Freq: Four times a day (QID) | ORAL | 0 refills | Status: DC | PRN

## 2017-03-21 MED ORDER — DOCUSATE SODIUM 100 MG PO CAPS: 100.0000 mg | ORAL_CAPSULE | Freq: Two times a day (BID) | ORAL | 0 refills | Status: DC

## 2017-03-21 MED ORDER — IBUPROFEN 600 MG PO TABS: 600.0000 mg | ORAL_TABLET | Freq: Four times a day (QID) | ORAL | 0 refills | Status: DC | PRN

## 2017-03-21 NOTE — Lactation Note (Signed)
This note was copied from a baby's chart. Lactation Consultation Note Baby 33 hrs old, crying a lot. Baby sucking on pacifier still fussing. Mom stated she doesn't know what's wrong. Baby had a wet diaper, gassy. Discussed cluster feeding. Mom stated she nipples were sore.  Assessed mom's cone shaped short shaft semi flat nipples. Mom's breast filling. Areola and nipples aren't very compressible. Hand expressed 3ml colostrum areolas became compressible for latching. Assisted in football position. Mom grimacing. Chin tug and upper lip flanged. Mom tense from pain. Unlatched baby applied #16 NS, mom stated latching much better. Baby resisting some, fussing and crying. No colostrum noted in NS. Applied #20 NS. Baby feeding, inserted colostrum prior to latching. Baby sucked area in NS dry. No colostrum noted in NS when unlatched. Breast a little softer, LC feels that breast should be a lot softer. Mom put baby back to breast in cradle position w/o NS. Comfort gels given. Mom states sore but tolerable. Baby passing gas. Discussed burping. Discussed breast filling, engorgement, prevention, support, comfort during feeding, monitoring I&O of baby, assessing breast for transfer, pumping, milk storage, and breast care. Shells given to mom. Encouraged to wear to evert nipple more and for soreness.  Mom has WIC. Discussed supply and demand w/supplementing.  Reminded of LC OP services. Encouraged to call for f/u appt. If using NS or if cont. To have soreness or difficulty.  Mom stated she felt like she would do OK.  Patient Name: Erin Dorcas CarrowLeshaun Krantz [id number]'s Date: 03/21/2017 Reason for consult: Initial assessment;Nipple pain/trauma   Maternal Data    Feeding Feeding Type: Breast Fed Length of feed: 20 min  LATCH Score Latch: Grasps breast easily, tongue down, lips flanged, rhythmical sucking.  Audible Swallowing: Spontaneous and intermittent  Type of Nipple: Everted at rest and after  stimulation  Comfort (Breast/Nipple): Filling, red/small blisters or bruises, mild/mod discomfort  Hold (Positioning): Assistance needed to correctly position infant at breast and maintain latch.  LATCH Score: 8  Interventions Interventions: Shells;Skin to skin;Adjust position;Breast compression;Assisted with latch;Comfort gels;Breast massage;Support pillows;Hand pump;Hand express;Position options;Expressed milk  Lactation Tools Discussed/Used Tools: Pump;Shells;Comfort gels;Nipple Shields Nipple shield size: 20;16(didn't want to use) Shell Type: Inverted Breast pump type: Manual WIC Program: Yes Pump Review: Setup, frequency, and cleaning;Milk Storage Initiated by:: RN Date initiated:: 03/20/17   Consult Status Consult Status: Complete Date: 03/21/17    Charyl DancerCARVER, Makayla Lanter G 03/21/2017, 5:56 AM

## 2017-03-21 NOTE — Progress Notes (Signed)
CSW received consult due to score 12 on Edinburgh Depression Screen.    When CSW arrived, MOB was dressing infant and FOB was preparing for MOB and infant's upcoming discharge.  CSW explained CSW's role and MOB gave CSW permission to complete the assessment while FOB was present. The couple was polite, easy to engage, and receptive to meeting with CSW.   CSW reviewed MOB's EDPS results and MOB disclosed feeling overwhelmed last night.  MOB stated, "I started crying last night but I'm not for sure why I was crying.  I'm happy about being a new mom and I have everything I need." CSW validated and normalized MOB's thoughts and feelings. CSW provided education regarding Baby Blues vs PMADs. CSW encouraged MOB to evaluate her mental health throughout the postpartum period with the use of the New Mom Checklist developed by Postpartum Progress and notify a medical professional if symptoms arise. CSW assessed for safety and MOB denied SI, HI, and DV.  MOB presented with insight and awareness and did not demonstrate any acute MH signs or symptoms. [facility].  CSW offered MOB resources for outpatient counseling and MOB declined.   CSW provide SIDS education and MOB and FOB responded appropriately to CSW questions and asked appropriate questions.   The family communicated support from MOB's and FOB's family.  There are no barriers to d/c.  Blaine HamperAngel Harrison, MSW, LCSW Clinical Social Work [phone]

## 2017-03-21 NOTE — Discharge Summary (Signed)
Obstetric Discharge Summary Reason for Admission: onset of labor Prenatal Procedures: NST and ultrasound Intrapartum Procedures: spontaneous vaginal delivery Postpartum Procedures: none Complications-Operative and Postpartum: 1st degree perineal laceration Hemoglobin  Date Value Ref Range Status  03/20/2017 9.1 (L) 12.0 - 15.0 g/dL Final   HCT  Date Value Ref Range Status  03/20/2017 27.8 (L) 36.0 - 46.0 % Final    Physical Exam:  General: alert, cooperative and appears stated age 25Lochia: appropriate Uterine Fundus: firm Incision: healing well DVT Evaluation: No evidence of DVT seen on physical exam.  Discharge Diagnoses: Term Pregnancy-delivered  Discharge Information: Date: 03/21/2017 Activity: pelvic rest Diet: routine Medications: Ibuprofen, Colace and Percocet Condition: improved Instructions: refer to practice specific booklet Discharge to: home Follow-up Information    Marlow Baarslark, [name], MD Follow up in 4 week(s).   Specialty:  Obstetrics Why:  For a postpartum evaluation Contact information: [address] [place] [address] [phone]           Newborn Data: Live born female  Birth Weight: 6 lb 8.9 oz (2974 g) APGAR: 8, 9  Newborn Delivery   Birth date/time:  03/19/2017 20:40:00 Delivery type:  Vaginal, Spontaneous     Home with mother.  Erin Harrison 03/21/2017, 8:46 AM

## 2020-07-18 ENCOUNTER — Encounter (HOSPITAL_COMMUNITY): Payer: Self-pay

## 2020-07-18 ENCOUNTER — Ambulatory Visit (HOSPITAL_COMMUNITY)
Admission: EM | Admit: 2020-07-18 | Discharge: 2020-07-18 | Disposition: A | Discharge status: Home / Self Care | Payer: BC Managed Care – PPO | Attending: Student | Admitting: Student

## 2020-07-18 ENCOUNTER — Other Ambulatory Visit: Payer: Self-pay

## 2020-07-18 DIAGNOSIS — R590 Localized enlarged lymph nodes: Secondary | ICD-10-CM | POA: Diagnosis not present

## 2020-07-18 DIAGNOSIS — Z3041 Encounter for surveillance of contraceptive pills: Secondary | ICD-10-CM

## 2020-07-18 DIAGNOSIS — R202 Paresthesia of skin: Secondary | ICD-10-CM | POA: Insufficient documentation

## 2020-07-18 DIAGNOSIS — S39012A Strain of muscle, fascia and tendon of lower back, initial encounter: Secondary | ICD-10-CM | POA: Diagnosis present

## 2020-07-18 DIAGNOSIS — Z113 Encounter for screening for infections with a predominantly sexual mode of transmission: Secondary | ICD-10-CM | POA: Diagnosis not present

## 2020-07-18 DIAGNOSIS — X58XXXA Exposure to other specified factors, initial encounter: Secondary | ICD-10-CM | POA: Diagnosis not present

## 2020-07-18 LAB — POCT URINALYSIS DIPSTICK, ED / UC
Bilirubin Urine: NEGATIVE
Glucose, UA: NEGATIVE mg/dL
Hgb urine dipstick: NEGATIVE
Ketones, ur: NEGATIVE mg/dL
Nitrite: NEGATIVE
Protein, ur: NEGATIVE mg/dL
Specific Gravity, Urine: 1.02 (ref 1.005–1.030)
Urobilinogen, UA: 0.2 mg/dL (ref 0.0–1.0)
pH: 8.5 — ABNORMAL HIGH (ref 5.0–8.0)

## 2020-07-18 LAB — POC URINE PREG, ED: Preg Test, Ur: NEGATIVE

## 2020-07-18 MED ORDER — TIZANIDINE HCL 2 MG PO CAPS: 2.0000 mg | ORAL_CAPSULE | Freq: Three times a day (TID) | ORAL | 0 refills | Status: DC

## 2020-07-18 MED ORDER — PREDNISONE 20 MG PO TABS: 40.0000 mg | ORAL_TABLET | Freq: Every day | ORAL | 0 refills | Status: AC

## 2020-07-18 NOTE — Discharge Instructions (Addendum)
For your back and leg pain:  -Start the muscle relaxer-Zanaflex (tizanidine), up to 3 times daily for muscle spasms and pain.  This can make you drowsy, so take at bedtime or when you do not need to drive or operate machinery. -Prednisone, 2 pills taken at the same time for 5 days in a row.  Try taking this earlier in the day as it can give you energy. Avoid ibuprofen while taking this medication, but you can use tylenol for additional relief. For your swollen lymph node (groin): -Your urine looks normal today.  We are screening for BV, yeast, gonorrhea, chlamydia, trichomonas.  We are also sending your urine to the lab to grow a culture to make sure there is no bacteria in it. -If you still have the swollen lymph node in 2 to 3 weeks, follow-up with a gynecologist.  Information below, call them to schedule this appointment. You an alternatively follow-up with the OB-GYN who supervised your pregnancy. -Seek additional medical attention if your symptoms get worse instead of better despite treatment.

## 2020-07-18 NOTE — ED Provider Notes (Signed)
[facility]    CSN: [id number] Arrival date & time: 07/18/20  1007      History   Chief Complaint Chief Complaint  Patient presents with   Back Pain   Leg Pain   Cyst    HPI Erin Harrison is a 25 y.o. female presenting with back pain with pain radiating down her legs and a lump in her groin.  Medical history chlamydia, UTI. -Describes 1 week of bilateral lower back pain, worse with standing and movement.  Pain radiating in the back of her legs with some tingling in the calves and toes.  Denies numbness of inner thighs, weakness in arms or legs.  Denies new trauma or overuse, though she is active for work.  Denies urinary symptoms including urinary retention and constipation. -Also with lump in her right groin for about 1 week, states this is painful.  She denies urinary symptoms including frequency, urgency, abdominal pain, flank pain, discharge, rashes/lesions, dysuria.  Denies new partners.  HPI  Past Medical History:  Diagnosis Date   Chlamydia    Headache    Hypoglycemia    Infection    UTI    Patient Active Problem List   Diagnosis Date Noted   Normal labor 03/19/2017    Past Surgical History:  Procedure Laterality Date   WISDOM TOOTH EXTRACTION      OB History     Gravida  2   Para  1   Term  1   Preterm      AB  1   Living  1      SAB      IAB  1   Ectopic      Multiple  0   Live Births  1            Home Medications    Prior to Admission medications   Medication Sig Start Date End Date Taking? Authorizing Provider  Acetaminophen (TYLENOL PO) Take by mouth as needed.   Yes [provider]  ibuprofen (ADVIL,MOTRIN) 600 MG tablet Take 1 tablet (600 mg total) by mouth every 6 (six) hours as needed. 03/21/17  Yes Waynard Reeds, MD  predniSONE (DELTASONE) 20 MG tablet Take 2 tablets (40 mg total) by mouth daily for 5 days. 07/18/20 07/23/20 Yes Rhys Martini, PA-C  tizanidine (ZANAFLEX) 2 MG capsule Take 1  capsule (2 mg total) by mouth 3 (three) times daily. 07/18/20  Yes Rhys Martini, PA-C    Family History Family History  Problem Relation Age of Onset   Thyroid disease Mother    Diabetes Father    Stroke Father    Cancer Maternal Grandmother        breast   Diabetes Paternal Grandfather    Hypertension Paternal Grandfather     Social History Social History   Tobacco Use   Smoking status: Never   Smokeless tobacco: Never  Substance Use Topics   Alcohol use: Yes    Comment: occ   Drug use: No     Allergies   Patient has no known allergies.   Review of Systems Review of Systems  Constitutional:  Negative for appetite change, chills, diaphoresis and fever.  Respiratory:  Negative for shortness of breath.   Cardiovascular:  Negative for chest pain.  Gastrointestinal:  Negative for abdominal pain, blood in stool, constipation, diarrhea, nausea and vomiting.  Genitourinary:  Negative for decreased urine volume, difficulty urinating, dysuria, flank pain, frequency, genital sores, hematuria, menstrual problem, pelvic  pain, urgency, vaginal bleeding, vaginal discharge and vaginal pain.       Lymph node   Musculoskeletal:  Positive for back pain.  Neurological:  Negative for dizziness, weakness and light-headedness.  All other systems reviewed and are negative.   Physical Exam Triage Vital Signs ED Triage Vitals  Enc Vitals Group     BP 07/18/20 1043 123/71     Pulse Rate 07/18/20 1043 72     Resp 07/18/20 1043 18     Temp 07/18/20 1043 99.5 F (37.5 C)     Temp Source 07/18/20 1043 Oral     SpO2 07/18/20 1043 100 %     Weight --      Height --      Head Circumference --      Peak Flow --      Pain Score 07/18/20 1039 6     Pain Loc --      Pain Edu? --      Excl. in GC? --    No data found.  Updated Vital Signs BP 123/71 (BP Location: Right Arm)   Pulse 72   Temp 99.5 F (37.5 C) (Oral)   Resp 18   LMP 07/10/2020   SpO2 100%   Visual Acuity Right  Eye Distance:   Left Eye Distance:   Bilateral Distance:    Right Eye Near:   Left Eye Near:    Bilateral Near:     Physical Exam Vitals reviewed.  Constitutional:      General: She is not in acute distress.    Appearance: Normal appearance. She is not ill-appearing.  HENT:     Head: Normocephalic and atraumatic.     Mouth/Throat:     Mouth: Mucous membranes are moist.     Comments: Moist mucous membranes Eyes:     Extraocular Movements: Extraocular movements intact.     Pupils: Pupils are equal, round, and reactive to light.  Cardiovascular:     Rate and Rhythm: Normal rate and regular rhythm.     Heart sounds: Normal heart sounds.  Pulmonary:     Effort: Pulmonary effort is normal.     Breath sounds: Normal breath sounds and air entry. No wheezing, rhonchi or rales.  Abdominal:     General: Bowel sounds are normal. There is no distension.     Palpations: Abdomen is soft. There is no mass.     Tenderness: There is no abdominal tenderness. There is no right CVA tenderness, left CVA tenderness, guarding or rebound.     Hernia: There is no hernia in the left inguinal area or right inguinal area.  Genitourinary:    Comments: R inguinal lymphadenopathy: one 1cm round mobile LN, tender to palpation. No hernia.  Pelvic exam deferred Musculoskeletal:     Cervical back: Normal range of motion. No swelling, deformity, signs of trauma, rigidity, spasms, tenderness, bony tenderness or crepitus. No pain with movement.     Thoracic back: No swelling, deformity, signs of trauma, spasms, tenderness or bony tenderness. Normal range of motion. No scoliosis.     Lumbar back: Spasms and tenderness present. No swelling, deformity, signs of trauma or bony tenderness. Normal range of motion. Negative right straight leg raise test and negative left straight leg raise test. No scoliosis.     Comments: Bilateral lumbar paraspinous muscle tenderness to palpation, pain elicited with standing and  extension of the lumbar spine.  No spinous deformity, step-off, tenderness.  Negative straight leg raise bilaterally.  Sensation  and strength intact in lower extremities.  DP 2+, cap refill less than 2 seconds.  No pedal edema or calf swelling.  Absolutely no other injury, deformity, tenderness, ecchymosis, abrasion.  Lymphadenopathy:     Lower Body: Right inguinal adenopathy present.  Skin:    General: Skin is warm.     Capillary Refill: Capillary refill takes less than 2 seconds.     Comments: Good skin turgor  Neurological:     General: No focal deficit present.     Mental Status: She is alert and oriented to person, place, and time.     Cranial Nerves: No cranial nerve deficit.  Psychiatric:        Mood and Affect: Mood normal.        Behavior: Behavior normal.        Thought Content: Thought content normal.        Judgment: Judgment normal.     UC Treatments / Results  Labs (all labs ordered are listed, but only abnormal results are displayed) Labs Reviewed  POCT URINALYSIS DIPSTICK, ED / UC - Abnormal; Notable for the following components:      Result Value   pH 8.5 (*)    Leukocytes,Ua TRACE (*)    All other components within normal limits  URINE CULTURE  POC URINE PREG, ED  CERVICOVAGINAL ANCILLARY ONLY    EKG   Radiology No results found.  Procedures Procedures (including critical care time)  Medications Ordered in UC Medications - No data to display  Initial Impression / Assessment and Plan / UC Course  I have reviewed the triage vital signs and the nursing notes.  Pertinent labs & imaging results that were available during my care of the patient were reviewed by me and considered in my medical decision making (see chart for details).     This patient is a 25 year old female presenting with lumbar strain and right inguinal lymphadenopathy.  No red flag symptoms.  Afebrile, nontachycardic, no abdominal pain or CVAT.  States she is not pregnant, and  denies STI risk.  UA with negative blood, negative nitrite, trace leuk. Culture sent. Given lack of urinary symptoms, will wait to treat. Urine pregnancy negative. OCP for contraception. She is not breastfeeding. Self swab sent for BV, yeast, gonorrhea, chlamydia, trichomonas.  For lymphadenopathy, follow-up with GYN if this persists past 2 weeks, information provided. She already has gyn she follows with.  For lumbar strain, prednisone and Zanaflex as below.  Red flag symptoms and ED return precautions discussed. Patient verbalizes understanding and agreement.   Coding this visit Level 4 for review of past labs and tests (5/4), ordering and review of labs today, prescription drug management.  Final Clinical Impressions(s) / UC Diagnoses   Final diagnoses:  Strain of lumbar region, initial encounter  Inguinal lymphadenopathy  Oral contraceptive pill surveillance     Discharge Instructions      For your back and leg pain:  -Start the muscle relaxer-Zanaflex (tizanidine), up to 3 times daily for muscle spasms and pain.  This can make you drowsy, so take at bedtime or when you do not need to drive or operate machinery. -Prednisone, 2 pills taken at the same time for 5 days in a row.  Try taking this earlier in the day as it can give you energy. Avoid ibuprofen while taking this medication, but you can use tylenol for additional relief. For your swollen lymph node (groin): -Your urine looks normal today.  We are screening for BV,  yeast, gonorrhea, chlamydia, trichomonas.  We are also sending your urine to the lab to grow a culture to make sure there is no bacteria in it. -If you still have the swollen lymph node in 2 to 3 weeks, follow-up with a gynecologist.  Information below, call them to schedule this appointment. You an alternatively follow-up with the OB-GYN who supervised your pregnancy. -Seek additional medical attention if your symptoms get worse instead of better despite  treatment.     ED Prescriptions     Medication Sig Dispense Auth. Provider   predniSONE (DELTASONE) 20 MG tablet Take 2 tablets (40 mg total) by mouth daily for 5 days. 10 tablet Rhys Martini, PA-C   tizanidine (ZANAFLEX) 2 MG capsule Take 1 capsule (2 mg total) by mouth 3 (three) times daily. 21 capsule Rhys Martini, PA-C      PDMP not reviewed this encounter.   Rhys Martini, PA-C 07/18/20 1144

## 2020-07-18 NOTE — ED Triage Notes (Signed)
Pt reports burning pain in legs/feet and aching pain in lower back/calves. Leg pain worse when holding still. Pain began about a week ago. Denies swelling. States tylenol has helped with burning and pain.  Also c/o palpable knot in groin area for the past week that started around same time as leg pain.  Reports periods have also been irregular.

## 2020-07-19 LAB — CERVICOVAGINAL ANCILLARY ONLY
Bacterial Vaginitis (gardnerella): POSITIVE — AB
Candida Glabrata: NEGATIVE
Candida Vaginitis: POSITIVE — AB
Chlamydia: NEGATIVE
Comment: NEGATIVE
Comment: NEGATIVE
Comment: NEGATIVE
Comment: NEGATIVE
Comment: NEGATIVE
Comment: NORMAL
Neisseria Gonorrhea: NEGATIVE
Trichomonas: POSITIVE — AB

## 2020-07-19 LAB — URINE CULTURE: Culture: >=100000 — AB

## 2020-07-21 ENCOUNTER — Telehealth (HOSPITAL_COMMUNITY): Payer: Self-pay | Admitting: Emergency Medicine

## 2020-07-21 MED ORDER — METRONIDAZOLE 500 MG PO TABS
500.0000 mg | ORAL_TABLET | Freq: Two times a day (BID) | ORAL | 0 refills | Status: DC
Start: 2020-07-21 — End: 2021-01-11

## 2020-07-21 MED ORDER — FLUCONAZOLE 150 MG PO TABS: 150.0000 mg | ORAL_TABLET | Freq: Once | ORAL | 0 refills | Status: AC

## 2021-01-11 ENCOUNTER — Other Ambulatory Visit: Payer: Self-pay

## 2021-01-11 ENCOUNTER — Inpatient Hospital Stay (HOSPITAL_COMMUNITY): Payer: Medicaid Other

## 2021-01-11 ENCOUNTER — Inpatient Hospital Stay (HOSPITAL_COMMUNITY)
Admission: AD | Admit: 2021-01-11 | Discharge: 2021-01-11 | Disposition: A | Discharge status: Home / Self Care | Payer: Medicaid Other | Attending: Obstetrics & Gynecology | Admitting: Obstetrics & Gynecology

## 2021-01-11 ENCOUNTER — Encounter (HOSPITAL_COMMUNITY): Payer: Self-pay | Admitting: *Deleted

## 2021-01-11 DIAGNOSIS — B9689 Other specified bacterial agents as the cause of diseases classified elsewhere: Secondary | ICD-10-CM | POA: Insufficient documentation

## 2021-01-11 DIAGNOSIS — O30041 Twin pregnancy, dichorionic/diamniotic, first trimester: Secondary | ICD-10-CM

## 2021-01-11 DIAGNOSIS — R109 Unspecified abdominal pain: Secondary | ICD-10-CM

## 2021-01-11 DIAGNOSIS — O3680X0 Pregnancy with inconclusive fetal viability, not applicable or unspecified: Secondary | ICD-10-CM

## 2021-01-11 DIAGNOSIS — O210 Mild hyperemesis gravidarum: Secondary | ICD-10-CM | POA: Diagnosis not present

## 2021-01-11 DIAGNOSIS — Z3A01 Less than 8 weeks gestation of pregnancy: Secondary | ICD-10-CM | POA: Insufficient documentation

## 2021-01-11 DIAGNOSIS — O23591 Infection of other part of genital tract in pregnancy, first trimester: Secondary | ICD-10-CM | POA: Insufficient documentation

## 2021-01-11 DIAGNOSIS — O30049 Twin pregnancy, dichorionic/diamniotic, unspecified trimester: Secondary | ICD-10-CM | POA: Diagnosis not present

## 2021-01-11 DIAGNOSIS — O219 Vomiting of pregnancy, unspecified: Secondary | ICD-10-CM

## 2021-01-11 LAB — GLUCOSE, CAPILLARY: Glucose-Capillary: 101 mg/dL — ABNORMAL HIGH (ref 70–99)

## 2021-01-11 LAB — CBC
HCT: 41 % (ref 36.0–46.0)
Hemoglobin: 14.1 g/dL (ref 12.0–15.0)
MCH: 30.3 pg (ref 26.0–34.0)
MCHC: 34.4 g/dL (ref 30.0–36.0)
MCV: 88.2 fL (ref 80.0–100.0)
Platelets: 281 10*3/uL (ref 150–400)
RBC: 4.65 MIL/uL (ref 3.87–5.11)
RDW: 12.1 % (ref 11.5–15.5)
WBC: 6.4 10*3/uL (ref 4.0–10.5)
nRBC: 0 % (ref 0.0–0.2)

## 2021-01-11 LAB — URINALYSIS, ROUTINE W REFLEX MICROSCOPIC
Bilirubin Urine: NEGATIVE
Glucose, UA: NEGATIVE mg/dL
Hgb urine dipstick: NEGATIVE
Ketones, ur: 15 mg/dL — AB
Leukocytes,Ua: NEGATIVE
Nitrite: NEGATIVE
Protein, ur: NEGATIVE mg/dL
Specific Gravity, Urine: 1.015 (ref 1.005–1.030)
pH: 8 (ref 5.0–8.0)

## 2021-01-11 LAB — COMPREHENSIVE METABOLIC PANEL
ALT: 11 U/L (ref 0–44)
AST: 19 U/L (ref 15–41)
Albumin: 4 g/dL (ref 3.5–5.0)
Alkaline Phosphatase: 42 U/L (ref 38–126)
Anion gap: 7 (ref 5–15)
BUN: <5 mg/dL — ABNORMAL LOW (ref 6–20)
CO2: 23 mmol/L (ref 22–32)
Calcium: 9.3 mg/dL (ref 8.9–10.3)
Chloride: 102 mmol/L (ref 98–111)
Creatinine, Ser: 0.63 mg/dL (ref 0.44–1.00)
GFR, Estimated: >60 mL/min (ref >60)
Glucose, Bld: 108 mg/dL — ABNORMAL HIGH (ref 70–99)
Potassium: 4.5 mmol/L (ref 3.5–5.1)
Sodium: 132 mmol/L — ABNORMAL LOW (ref 135–145)
Total Bilirubin: 0.8 mg/dL (ref 0.3–1.2)
Total Protein: 7 g/dL (ref 6.5–8.1)

## 2021-01-11 LAB — HCG, QUANTITATIVE, PREGNANCY: hCG, Beta Chain, Quant, S: 34232 m[IU]/mL — ABNORMAL HIGH (ref <5)

## 2021-01-11 LAB — POCT PREGNANCY, URINE: Preg Test, Ur: POSITIVE — AB

## 2021-01-11 LAB — WET PREP, GENITAL
Sperm: NONE SEEN
Trich, Wet Prep: NONE SEEN
WBC, Wet Prep HPF POC: <10 (ref <10)
Yeast Wet Prep HPF POC: NONE SEEN

## 2021-01-11 MED ORDER — DOXYLAMINE-PYRIDOXINE 10-10 MG PO TBEC: 2.0000 | DELAYED_RELEASE_TABLET | Freq: Every day | ORAL | 2 refills | Status: DC

## 2021-01-11 MED ORDER — ONDANSETRON 4 MG PO TBDP
8.0000 mg | ORAL_TABLET | Freq: Once | ORAL | Status: AC
  Administered 2021-01-11: 8 mg via ORAL
  Filled 2021-01-11: qty 2

## 2021-01-11 MED ORDER — METRONIDAZOLE 0.75 % VA GEL
1.0000 | Freq: Every day | VAGINAL | 0 refills | Status: DC
Start: 2021-01-11 — End: 2021-02-18

## 2021-01-11 NOTE — MAU Note (Signed)
Presents with c/o N/V/D, reports has vomited once in 24 hours & had 2 episodes of diarrhea in in 24 hours.  Also states has lower abdominal cramping, denies VB.  Unsure LMP, approximating beginning of November.  +HPT.

## 2021-01-11 NOTE — MAU Provider Note (Addendum)
History    Chief Complaint  Patient presents with   Nausea   Cramping   Erin Harrison is a 25 y/o G3P1011 who presents with complaints of nausea, vomiting, and diarrhea. Pt notes that she began feeling nauseous on Tuesday, 12/13 and has vomited (non-bloody, non-bilious) 3-5 times since Tuesday, none since yesterday. She has had limited PO intake due to her nausea. She also notes that she had three episodes of watery diarrhea over the last few days, also without blood, her last episode being yesterday morning. She additionally reports lower abdominal cramping for up to a minute, several times an hour, that she ranks 8/10, brings tears to her eyes, and forced her to leave work early. She took a home pregnancy test yesterday which was positive.     Past Medical History:  Diagnosis Date   Chlamydia    Headache    Hypoglycemia    Infection    UTI    Past Surgical History:  Procedure Laterality Date   WISDOM TOOTH EXTRACTION      Family History  Problem Relation Age of Onset   Thyroid disease Mother    Diabetes Father    Stroke Father    Cancer Maternal Grandmother        breast   Diabetes Paternal Grandfather    Hypertension Paternal Grandfather     Social History   Tobacco Use   Smoking status: Never   Smokeless tobacco: Never  Substance Use Topics   Alcohol use: Not Currently    Comment: occ   Drug use: No    Allergies: No Known Allergies  Medications Prior to Admission  Medication Sig Dispense Refill Last Dose   Acetaminophen (TYLENOL PO) Take by mouth as needed.      ibuprofen (ADVIL,MOTRIN) 600 MG tablet Take 1 tablet (600 mg total) by mouth every 6 (six) hours as needed. 90 tablet 0    metroNIDAZOLE (FLAGYL) 500 MG tablet Take 1 tablet (500 mg total) by mouth 2 (two) times daily. 14 tablet 0    tizanidine (ZANAFLEX) 2 MG capsule Take 1 capsule (2 mg total) by mouth 3 (three) times daily. 21 capsule 0     Review of Systems  Constitutional:  Negative for  chills, diaphoresis and fever.  HENT:  Negative for rhinorrhea and sinus pressure.   Respiratory:  Negative for cough, chest tightness and shortness of breath.   Cardiovascular:  Negative for chest pain.  Gastrointestinal:  Positive for diarrhea, nausea and vomiting.  Genitourinary:  Negative for vaginal bleeding and vaginal discharge.  Neurological:  Positive for light-headedness (Present with painful cramping). Negative for dizziness and headaches.  Physical Exam Blood pressure 132/81, pulse 76, temperature 98.3 F (36.8 C), temperature source Oral, resp. rate 18, height 5\' 2"  (1.575 m), weight 73.5 kg, SpO2 100 %, unknown if currently breastfeeding. Physical Exam Constitutional:      Appearance: Normal appearance. She is normal weight.  HENT:     Head: Normocephalic and atraumatic.     Mouth/Throat:     Mouth: Mucous membranes are moist.  Eyes:     General: No scleral icterus. Cardiovascular:     Rate and Rhythm: Normal rate and regular rhythm.     Heart sounds: Normal heart sounds.  Pulmonary:     Effort: Pulmonary effort is normal.     Breath sounds: Normal breath sounds.  Abdominal:     General: There is no distension.     Palpations: Abdomen is soft.  Tenderness: There is no abdominal tenderness. There is no guarding or rebound.  Skin:    General: Skin is warm and dry.  Neurological:     General: No focal deficit present.     Mental Status: She is alert and oriented to person, place, and time.  Psychiatric:        Mood and Affect: Mood normal.        Behavior: Behavior normal.    MAU Course Procedures  MDM Pt presents with n/v/d for 2-3 days, and significant abdominal cramping. Has not had fevers, chills, cough, or other symptoms concerning for infection. Has hx of BV, trich and yeast infxns in June, so STI is on the differential. Reassured by absence of vaginal bleeding and discharge but given significant cramping would like to obtain an OB US w/ transvaginal and  obtain an initial beta-hcg level.   CBC returned and is entirely WNL. CMP also largely unremarkable outside mild hyponatremia at 132.  UA benign, and not clinically showing signs of dehydration, so will defer IV fluids Wet prep w/ Clue cells consistent with BV Beta-hCG at 34,232 12/15 US OB <14wks showing dichorionic diamniotic twin gestation Zofran for nausea, also can constipate which will be helpful given recent diarrhea.  Assessment: Pt presents with n/v/d and cramping. Found to have BV infection and a di-di twin pregnancy, likely contributing to her increased n/v, and consistent with relatively higher b-hcg.  Plan: -Doxylamine-pyridoxine qhs for n/v -Metrogel qhs for 5 nights -Pt will f/u with Nestor Ramp for her [facility] care   Attestation of Supervision of Student:  I confirm that I have verified the information documented in the medical students note and that I have also personally reperformed the history, physical exam and all medical decision making activities.  I have verified that all services and findings are accurately documented in this student's note; and I agree with management and plan as outlined in the documentation. I have also made any necessary editorial changes.  Physical Exam: No apparent distress Chest: Normal respiratory effort, HR normal Abdomen soft, NT, Heating pad in place Skin: Warm, Dry  Provider reviews US findings showing early twin gestation.  Discussed need for repeat US in 2-3 weeks.  Patient requests and given [place] pics.  Informed that no embryos yet identified, but shown gestational sac and yolk sac x 2.    Reviewed and sent scripts Metrogel usage and Diclegis.  Patient plans to follow up with primary ob-[facility].  Bleeding Precautions Given   Cherre Robins, CNM [facility] [employer], [facility] 01/11/2021 4:33 PM

## 2021-01-12 LAB — GC/CHLAMYDIA PROBE AMP (~~LOC~~) NOT AT ARMC
Chlamydia: NEGATIVE
Comment: NEGATIVE
Comment: NORMAL
Neisseria Gonorrhea: NEGATIVE

## 2021-02-13 ENCOUNTER — Telehealth (INDEPENDENT_AMBULATORY_CARE_PROVIDER_SITE_OTHER): Payer: Self-pay

## 2021-02-13 DIAGNOSIS — O09899 Supervision of other high risk pregnancies, unspecified trimester: Secondary | ICD-10-CM

## 2021-02-13 DIAGNOSIS — O0990 Supervision of high risk pregnancy, unspecified, unspecified trimester: Secondary | ICD-10-CM | POA: Insufficient documentation

## 2021-02-13 DIAGNOSIS — O30049 Twin pregnancy, dichorionic/diamniotic, unspecified trimester: Secondary | ICD-10-CM | POA: Insufficient documentation

## 2021-02-13 DIAGNOSIS — O30041 Twin pregnancy, dichorionic/diamniotic, first trimester: Secondary | ICD-10-CM

## 2021-02-13 DIAGNOSIS — Z3A00 Weeks of gestation of pregnancy not specified: Secondary | ICD-10-CM

## 2021-02-13 NOTE — Patient Instructions (Signed)
  At our [facility], we work as an integrated team, providing care to address both physical and emotional health. Your medical provider may refer you to see our Behavioral Health Clinician (BHC) on the same day you see your medical provider, as availability permits; often scheduled virtually at your convenience.  Our BHC is available to all patients, visits generally last between 20-30 minutes, but can be longer or shorter, depending on patient need. The BHC offers help with stress management, coping with symptoms of depression and anxiety, major life changes , sleep issues, changing risky behavior, grief and loss, life stress, working on personal life goals, and  behavioral health issues, as these all affect your overall health and wellness.  The BHC is NOT available for the following: FMLA paperwork, court-ordered evaluations, specialty assessments (custody or disability), letters to employers, or obtaining certification for an emotional support animal. The BHC does not provide long-term therapy. You have the right to refuse integrated behavioral health services, or to reschedule to see the BHC at a later date.  Confidentiality exception: If it is suspected that a child or disabled adult is being abused or neglected, we are required by law to report that to either Child Protective Services or Adult Protective Services.  If you have a diagnosis of Bipolar affective disorder, Schizophrenia, or recurrent Major depressive disorder, we will recommend that you establish care with a psychiatrist, as these are lifelong, chronic conditions, and we want your overall emotional health and medications to be more closely monitored. If you anticipate needing extended maternity leave due to mental health issues postpartum, it it recommended you inform your medical provider, so we can put in a referral to a psychiatrist as soon as possible. The BHC is unable to recommend an extended maternity leave for mental  health issues. Your medical provider or BHC may refer you to a therapist for ongoing, traditional therapy, or to a psychiatrist, for medication management, if it would benefit your overall health. Depending on your insurance, you may have a copay or be charged a deductible, depending on your insurance, to see the BHC. If you are uninsured, it is recommended that you apply for financial assistance. (Forms may be requested at the front desk for in-person visits, via MyChart, or request a form during a virtual visit).  If you see the BHC more than 6 times, you will have to complete a comprehensive clinical assessment interview with the BHC to resume integrated services.  For virtual visits with the BHC, you must be physically in the state of [place] at the time of the visit. For example, if you live in [place], you will have to do an in-person visit with the BHC, and your out-of-state insurance may not cover behavioral health services in [place]. If you are going out of the state or country for any reason, the BHC may see you virtually when you return to [place], but not while you are physically outside of [place].    

## 2021-02-13 NOTE — Progress Notes (Signed)
Chart reviewed for nurse visit. Agree with plan of care.   Marny Lowenstein, PA-C 02/13/2021 3:56 PM

## 2021-02-13 NOTE — Progress Notes (Signed)
New OB Intake  I connected with  Erin Harrison on 02/13/21 at 11:15 AM EST by MyChart Video Visit and verified that I am speaking with the correct person using two identifiers. Nurse is located at [facility] and pt is located at home.  I discussed the limitations, risks, security and privacy concerns of performing an evaluation and management service by telephone and the availability of in person appointments. I also discussed with the patient that there may be a patient responsible charge related to this service. The patient expressed understanding and agreed to proceed.  I explained I am completing New OB Intake today. We discussed her EDD of 09/08/21 that is based on LMP of 12/02/20. Pt is G3/P1. I reviewed her allergies, medications, Medical/Surgical/OB history, and appropriate screenings. I informed her of [facility] services. Based on history, this is a/an  pregnancy complicated by Twins  .   Patient Active Problem List   Diagnosis Date Noted   Normal labor 03/19/2017    Concerns addressed today  Delivery Plans:  Plans to deliver at [facility].   MyChart/Babyscripts MyChart access verified. I explained pt will have some visits in office and some virtually. Babyscripts instructions given and order placed. Patient verifies receipt of registration text/e-mail. Account successfully created and app downloaded.  Blood Pressure Cuff  Patient is self-pay; explained patient will be given BP cuff at first prenatal appt. Explained after first prenatal appt pt will check weekly and document in Babyscripts.  Weight scale: Patient does / does not  have weight scale. Weight scale ordered for patient to pick up from [employer].   Anatomy US Explained first scheduled Korea will be around 19 weeks. Anatomy US scheduled for 04/16/21 at 08:45a. Pt notified to arrive at 08:30a.  Labs Discussed Avelina Laine genetic screening with patient. Would like both Panorama and Horizon drawn at new OB visit. Routine prenatal  labs needed.  Covid Vaccine Patient has covid vaccine.   CenteringPregnancy Candidate?  If yes, offer as possibility  Mother/ Baby Dyad Candidate?    If yes, offer as possibility  Informed patient of [facility] website  and placed link in her AVS.   Social Determinants of Health Food Insecurity: Patient expresses food insecurity. Food Market information given to patient; explained patient may visit at the end of first OB appointment. WIC Referral: Patient is interested in referral to [facility].  Transportation: Patient denies transportation needs. Childcare: Discussed no children allowed at ultrasound appointments. Offered childcare services; patient declines childcare services at this time.  Send link to Pregnancy Navigators   Placed OB Box on problem list and updated  First visit review I reviewed new OB appt with pt. I explained she will have a pelvic exam, ob bloodwork with genetic screening, and PAP smear. Explained pt will be seen by Dow Adolph at first visit; encounter routed to appropriate provider. Explained that patient will be seen by pregnancy navigator following visit with provider. [facility] information placed in AVS.   Erin Harrison, CMA 02/13/2021  11:24 AM

## 2021-02-14 ENCOUNTER — Telehealth: Payer: Self-pay

## 2021-02-14 NOTE — Telephone Encounter (Signed)
Pt was interested in the Centering Program but due to her having twins she does not qualify for the program, called Pt to advise, no answer, left VM.

## 2021-02-18 ENCOUNTER — Encounter (HOSPITAL_COMMUNITY): Payer: Self-pay | Admitting: Family Medicine

## 2021-02-18 ENCOUNTER — Other Ambulatory Visit: Payer: Self-pay

## 2021-02-18 ENCOUNTER — Inpatient Hospital Stay (HOSPITAL_COMMUNITY)
Admission: AD | Admit: 2021-02-18 | Discharge: 2021-02-18 | Disposition: A | Discharge status: Home / Self Care | Payer: Medicaid Other | Attending: Family Medicine | Admitting: Family Medicine

## 2021-02-18 DIAGNOSIS — G44209 Tension-type headache, unspecified, not intractable: Secondary | ICD-10-CM | POA: Diagnosis not present

## 2021-02-18 DIAGNOSIS — O30041 Twin pregnancy, dichorionic/diamniotic, first trimester: Secondary | ICD-10-CM

## 2021-02-18 DIAGNOSIS — R202 Paresthesia of skin: Secondary | ICD-10-CM

## 2021-02-18 DIAGNOSIS — M25511 Pain in right shoulder: Secondary | ICD-10-CM | POA: Diagnosis not present

## 2021-02-18 DIAGNOSIS — O26899 Other specified pregnancy related conditions, unspecified trimester: Secondary | ICD-10-CM

## 2021-02-18 DIAGNOSIS — O26891 Other specified pregnancy related conditions, first trimester: Secondary | ICD-10-CM | POA: Diagnosis not present

## 2021-02-18 DIAGNOSIS — O0991 Supervision of high risk pregnancy, unspecified, first trimester: Secondary | ICD-10-CM | POA: Insufficient documentation

## 2021-02-18 DIAGNOSIS — O99351 Diseases of the nervous system complicating pregnancy, first trimester: Secondary | ICD-10-CM | POA: Insufficient documentation

## 2021-02-18 DIAGNOSIS — R109 Unspecified abdominal pain: Secondary | ICD-10-CM | POA: Diagnosis not present

## 2021-02-18 DIAGNOSIS — R102 Pelvic and perineal pain: Secondary | ICD-10-CM | POA: Insufficient documentation

## 2021-02-18 DIAGNOSIS — Z3491 Encounter for supervision of normal pregnancy, unspecified, first trimester: Secondary | ICD-10-CM

## 2021-02-18 DIAGNOSIS — Z3A11 11 weeks gestation of pregnancy: Secondary | ICD-10-CM | POA: Diagnosis not present

## 2021-02-18 DIAGNOSIS — O0990 Supervision of high risk pregnancy, unspecified, unspecified trimester: Secondary | ICD-10-CM

## 2021-02-18 LAB — URINALYSIS, ROUTINE W REFLEX MICROSCOPIC
Bilirubin Urine: NEGATIVE
Glucose, UA: NEGATIVE mg/dL
Hgb urine dipstick: NEGATIVE
Ketones, ur: NEGATIVE mg/dL
Nitrite: NEGATIVE
Protein, ur: NEGATIVE mg/dL
Specific Gravity, Urine: 1.02 (ref 1.005–1.030)
pH: 7 (ref 5.0–8.0)

## 2021-02-18 LAB — WET PREP, GENITAL
Sperm: NONE SEEN
Trich, Wet Prep: NONE SEEN
WBC, Wet Prep HPF POC: >=10 — AB (ref <10)
Yeast Wet Prep HPF POC: NONE SEEN

## 2021-02-18 MED ORDER — ACETAMINOPHEN 500 MG PO TABS
1000.0000 mg | ORAL_TABLET | Freq: Four times a day (QID) | ORAL | 3 refills | Status: DC | PRN
Start: 2021-02-18 — End: 2021-08-06

## 2021-02-18 MED ORDER — ACETAMINOPHEN 500 MG PO TABS
1000.0000 mg | ORAL_TABLET | Freq: Once | ORAL | Status: AC
  Administered 2021-02-18: 1000 mg via ORAL
  Filled 2021-02-18: qty 2

## 2021-02-18 MED ORDER — CYCLOBENZAPRINE HCL 10 MG PO TABS: 10.0000 mg | ORAL_TABLET | Freq: Three times a day (TID) | ORAL | 1 refills | Status: DC | PRN

## 2021-02-18 MED ORDER — CYCLOBENZAPRINE HCL 5 MG PO TABS
10.0000 mg | ORAL_TABLET | Freq: Once | ORAL | Status: AC
Start: 2021-02-18 — End: 2021-02-18
  Administered 2021-02-18: 10 mg via ORAL
  Filled 2021-02-18: qty 2

## 2021-02-18 NOTE — MAU Provider Note (Signed)
Chief Complaint:  arm numbness, Headache, Shoulder Pain, and Abdominal Pain   Event Date/Time   First Provider Initiated Contact with Patient 02/18/21 1116     HPI: Erin Harrison is a 26 y.o. G3P1011 at [redacted]w[redacted]d who presents to maternity admissions reporting pain in her right shoulder, especially at night as well as numbness in her left hand. Years ago she had pain in the same shoulder and was told her "muscles are growing over her tendons" and to just avoid sleeping on it. When not pregnant, she sleeps on her back and this is not a problem. For the past two nights, the pain has been intense causing a mild transient headache and difficulty sleeping. She has tried one dose of 500mg  Tylenol which did not help the headache. Also having some mild cramping, nothing consistent and feels like growing pains, but wants to make sure the babies are ok. Denies vaginal bleeding, leaking of fluid, decreased fetal movement, fever, fall, or recent illness.   Pregnancy Course: Receives care at [facility] [place] intake visit was on 02/13/21  Past Medical History:  Diagnosis Date   Chlamydia    Headache    Hypoglycemia    Infection    UTI   OB History  Gravida Para Term Preterm AB Living  3 1 1   1 1   SAB IAB Ectopic Multiple Live Births    1   0 1    # Outcome Date GA Lbr Len/2nd Weight Sex Delivery Anes PTL Lv  3 Current           2 Term 03/19/17 [redacted]w[redacted]d 16:15 / 00:25 6 lb 8.9 oz (2.974 kg) F Vag-Spont EPI  LIV  1 IAB 2017           Past Surgical History:  Procedure Laterality Date   DILATION AND CURETTAGE OF UTERUS     WISDOM TOOTH EXTRACTION     Family History  Problem Relation Age of Onset   Thyroid disease Mother    Diabetes Father    Stroke Father    Cancer Maternal Grandmother        breast   Diabetes Paternal Grandfather    Hypertension Paternal Grandfather    Social History   Tobacco Use   Smoking status: Never   Smokeless tobacco: Never  Vaping Use   Vaping Use: Never used  Substance  Use Topics   Alcohol use: Not Currently    Comment: occ   Drug use: No   No Known Allergies No medications prior to admission.   I have reviewed patient's Past Medical Hx, Surgical Hx, Family Hx, Social Hx, medications and allergies.   ROS:  Review of Systems  HENT:  Negative for congestion and sore throat.   Eyes:  Negative for photophobia and visual disturbance.  Respiratory:  Negative for cough and shortness of breath.   Gastrointestinal:  Negative for diarrhea, nausea and vomiting.  Genitourinary:  Negative for vaginal bleeding and vaginal discharge.  Musculoskeletal:  Positive for arthralgias (shoulder pain).  Neurological:  Positive for numbness (left hand) and headaches.   Physical Exam  No data found.  Constitutional: Well-developed, well-nourished female in no acute distress.  Cardiovascular: normal rate & rhythm Respiratory: normal effort GI: Abd soft, non-tender, gravid appropriate for gestational age. MS: Extremities nontender, no edema, normal ROM Neurologic: Alert and oriented x 4.  GU: no CVA tenderness Pelvic: exam deferred  Pt informed that the ultrasound is considered a limited OB ultrasound and is not intended to be  a complete ultrasound exam.  Patient also informed that the ultrasound is not being completed with the intent of assessing for fetal or placental anomalies or any pelvic abnormalities.  Explained that the purpose of todays ultrasound is to assess for  fetal heart tones .  Patient acknowledges the purpose of the exam and the limitations of the study. Fetal heart tones noted, Baby A: 140s, Baby B:150s. Vigorous fetal movement noted, pt reassured.   Labs: No results found for this or any previous visit (from the past 24 hour(s)).   Imaging:  No results found.  MAU Course: Orders Placed This Encounter  Procedures   Wet prep, genital   Urinalysis, Routine w reflex microscopic Urine, Clean Catch   AMB referral to orthopedics   Discharge patient    Meds ordered this encounter  Medications   acetaminophen (TYLENOL) tablet 1,000 mg   cyclobenzaprine (FLEXERIL) tablet 10 mg   acetaminophen (TYLENOL) 500 MG tablet    Sig: Take 2 tablets (1,000 mg total) by mouth every 6 (six) hours as needed.    Dispense:  30 tablet    Refill:  3    Order Specific Question:   Supervising Provider    Answer:   Donnamae Jude [[id number]]   cyclobenzaprine (FLEXERIL) 10 MG tablet    Sig: Take 1 tablet (10 mg total) by mouth every 8 (eight) hours as needed for muscle spasms.    Dispense:  30 tablet    Refill:  1    Order Specific Question:   Supervising Provider    Answer:   Merrily Pew   MDM: Encouraged pt to drink fluids while in MAU, also gave Tylenol and flexeril for headache/shoulder pain. Fluids resolved the mild cramping, the medications improved her shoulder pain and ability to move her arm more freely. Referral placed to physical therapy.  Assessment: 1. Supervision of high risk pregnancy, antepartum   2. Dichorionic diamniotic twin pregnancy in first trimester   3. Acute pain of right shoulder   4. Left hand paresthesia   5. Acute non intractable tension-type headache   6. Pelvic cramping in antepartum period   7. Fetal heart tones present, first trimester   8. [redacted] weeks gestation of pregnancy    Plan: Discharge home in stable condition with return precautions.     Follow-up [place] for [facility] Follow up.   Specialty: Obstetrics and Gynecology Why: as scheduled for ongoing prenatal care Contact information: [address] [place] [phone]                Allergies as of 02/18/2021   No Known Allergies      Medication List     STOP taking these medications    metroNIDAZOLE 0.75 % vaginal gel Commonly known as: METROGEL VAGINAL       TAKE these medications    acetaminophen 500 MG tablet Commonly known as:  TYLENOL Take 2 tablets (1,000 mg total) by mouth every 6 (six) hours as needed. What changed:  medication strength how much to take when to take this   cyclobenzaprine 10 MG tablet Commonly known as: FLEXERIL Take 1 tablet (10 mg total) by mouth every 8 (eight) hours as needed for muscle spasms.   Doxylamine-Pyridoxine 10-10 MG Tbec Take 2 tablets by mouth at bedtime.   prenatal vitamin w/FE, FA 27-1 MG Tabs tablet Take 1 tablet by mouth daily at 12 noon.  Gaylan Gerold, CNM, MSN, [place] Certified Nurse Midwife, [facility]

## 2021-02-18 NOTE — MAU Note (Signed)
...  Erin Harrison is a 26 y.o. at [redacted]w[redacted]d here in MAU reporting: HA with occasional visual floaters for the past two days, she states she took 500 mg of Tylenol yesterday around 0930 and her HA ceased. She states a few hours later it returned and has not stopped since.  She is also experiencing lower abdominal cramping since yesterday that is intermittent.  She is also endorsing right shoulder pain since this past Friday, the 20th, and has been experiencing left arm numbness when she lies on her arm to go to sleep. She states she cannot lift her right arm.  [name].   Pain scores: 3/10 HA 3/10 lower abdominal  Lab orders placed from triage:  UA

## 2021-02-19 ENCOUNTER — Encounter: Payer: Self-pay | Admitting: *Deleted

## 2021-02-19 LAB — GC/CHLAMYDIA PROBE AMP (~~LOC~~) NOT AT ARMC
Chlamydia: NEGATIVE
Comment: NEGATIVE
Comment: NORMAL
Neisseria Gonorrhea: NEGATIVE

## 2021-02-20 ENCOUNTER — Other Ambulatory Visit (HOSPITAL_BASED_OUTPATIENT_CLINIC_OR_DEPARTMENT_OTHER): Payer: Self-pay | Admitting: Orthopaedic Surgery

## 2021-02-20 DIAGNOSIS — M25511 Pain in right shoulder: Secondary | ICD-10-CM

## 2021-02-20 DIAGNOSIS — M79642 Pain in left hand: Secondary | ICD-10-CM

## 2021-02-21 ENCOUNTER — Ambulatory Visit (HOSPITAL_BASED_OUTPATIENT_CLINIC_OR_DEPARTMENT_OTHER)
Admission: RE | Admit: 2021-02-21 | Discharge: 2021-02-21 | Disposition: A | Discharge status: Home / Self Care | Payer: Medicaid Other | Source: Ambulatory Visit | Attending: Orthopaedic Surgery | Admitting: Orthopaedic Surgery

## 2021-02-21 ENCOUNTER — Ambulatory Visit (INDEPENDENT_AMBULATORY_CARE_PROVIDER_SITE_OTHER): Payer: Medicaid Other | Admitting: Orthopaedic Surgery

## 2021-02-21 ENCOUNTER — Other Ambulatory Visit: Payer: Self-pay

## 2021-02-21 DIAGNOSIS — M79642 Pain in left hand: Secondary | ICD-10-CM | POA: Insufficient documentation

## 2021-02-21 DIAGNOSIS — M7521 Bicipital tendinitis, right shoulder: Secondary | ICD-10-CM | POA: Diagnosis not present

## 2021-02-21 DIAGNOSIS — M25511 Pain in right shoulder: Secondary | ICD-10-CM | POA: Insufficient documentation

## 2021-02-21 MED ORDER — LIDOCAINE HCL 1 % IJ SOLN
4.0000 mL | INTRAMUSCULAR | Status: AC | PRN
  Administered 2021-02-21: 16:00:00 4 mL

## 2021-02-21 MED ORDER — TRIAMCINOLONE ACETONIDE 40 MG/ML IJ SUSP
80.0000 mg | INTRAMUSCULAR | Status: AC | PRN
  Administered 2021-02-21: 16:00:00 80 mg via INTRA_ARTICULAR

## 2021-02-21 NOTE — Progress Notes (Signed)
Chief Complaint: Right shoulder pain as well as left hand numbness     History of Present Illness:    Erin Harrison is a 26 y.o. female presents with right shoulder pain has been going on for the last several months.  Not have any specific injury.  She is having difficulty lying directly on the side.  She states that she had a similar issue for evaluation prior which resolved.  She has pain with overhead lifting on the right side.  This is her dominant arm.  She is taking Tylenol as needed which is somewhat helpful.  With regard to the left hand she is experiencing numbness particularly at night in all of her fingers.  She is currently pregnant with twins    Surgical History:   None  PMH/PSH/Family History/Social History/Meds/Allergies:    Past Medical History:  Diagnosis Date   Chlamydia    Headache    Hypoglycemia    Infection    UTI   Past Surgical History:  Procedure Laterality Date   DILATION AND CURETTAGE OF UTERUS     WISDOM TOOTH EXTRACTION     Social History   Socioeconomic History   Marital status: Single    Spouse name: Not on file   Number of children: Not on file   Years of education: Not on file   Highest education level: Not on file  Occupational History   Not on file  Tobacco Use   Smoking status: Never   Smokeless tobacco: Never  Vaping Use   Vaping Use: Never used  Substance and Sexual Activity   Alcohol use: Not Currently    Comment: occ   Drug use: No   Sexual activity: Yes    Birth control/protection: None  Other Topics Concern   Not on file  Social History Narrative   Not on file   Social Determinants of Health   Financial Resource Strain: Not on file  Food Insecurity: Not on file  Transportation Needs: Not on file  Physical Activity: Not on file  Stress: Not on file  Social Connections: Not on file   Family History  Problem Relation Age of Onset   Thyroid disease Mother     Diabetes Father    Stroke Father    Cancer Maternal Grandmother        breast   Diabetes Paternal Grandfather    Hypertension Paternal Grandfather    No Known Allergies Current Outpatient Medications  Medication Sig Dispense Refill   acetaminophen (TYLENOL) 500 MG tablet Take 2 tablets (1,000 mg total) by mouth every 6 (six) hours as needed. 30 tablet 3   cyclobenzaprine (FLEXERIL) 10 MG tablet Take 1 tablet (10 mg total) by mouth every 8 (eight) hours as needed for muscle spasms. 30 tablet 1   Doxylamine-Pyridoxine 10-10 MG TBEC Take 2 tablets by mouth at bedtime. 90 tablet 2   prenatal vitamin w/FE, FA (PRENATAL 1 + 1) 27-1 MG TABS tablet Take 1 tablet by mouth daily at 12 noon.     No current facility-administered medications for this visit.   No results found.  Review of Systems:   A ROS was performed including pertinent positives and negatives as documented in the HPI.  Physical Exam :   Constitutional: NAD and appears stated age Neurological: Alert and oriented Psych: Appropriate  affect and cooperative unknown if currently breastfeeding.   Comprehensive Musculoskeletal Exam:    Musculoskeletal Exam    Inspection Right Left  Skin No atrophy or winging No atrophy or winging  Palpation    Tenderness Biceps None  Range of Motion    Flexion (passive) 170 170  Flexion (active) 170 170  Abduction 170 170  ER at the side 70 70  Can reach behind back to T12 T12  Strength     Full Full  Special Tests    Pseudoparalytic No No  Neurologic    Fires PIN, radial, median, ulnar, musculocutaneous, axillary, suprascapular, long thoracic, and spinal accessory innervated muscles. No abnormal sensibility  Vascular/Lymphatic    Radial Pulse 2+ 2+  Cervical Exam    Patient has symmetric cervical range of motion with negative Spurling's test.  Special Test: Positive speeds test     Imaging:   Xray (3 views right shoulder, 3 views left wrist): Normal   I  personally reviewed and interpreted the radiographs.   Assessment:   26 year old female with right biceps tendinitis as well as left hand carpal tunnel syndrome.  With regard to the carpal tunnel I have advised that this is not uncommon during pregnancy and frequently resolves completely after birth of the children.  With regard to the right shoulder I do believe that she has some biceps tendinitis.  I have offered her an ultrasound-guided injection of the right shoulder which she would like to obtain today.  She will follow-up in 1 month if her pain is not completely resolved  Plan :    -Right shoulder biceps ultrasound-guided injection performed after verbal consent obtained    Procedure Note  Patient: Erin Harrison             Date of Birth: Oct 05, 1995           MRN: [id number]             Visit Date: 02/21/2021  Procedures: Visit Diagnoses: No diagnosis found.  Large Joint Inj on 02/21/2021 4:25 PM Indications: pain Details: 22 G 1.5 in needle, ultrasound-guided anterior approach  Arthrogram: No  Medications: 4 mL lidocaine 1 %; 80 mg triamcinolone acetonide 40 MG/ML Outcome: tolerated well, no immediate complications Procedure, treatment alternatives, risks and benefits explained, specific risks discussed. Consent was given by the patient. Immediately prior to procedure a time out was called to verify the correct patient, procedure, equipment, support staff and site/side marked as required. Patient was prepped and draped in the usual sterile fashion.          I personally saw and evaluated the patient, and participated in the management and treatment plan.  Huel Cote, MD Attending Physician, Orthopedic Surgery  This document was dictated using Dragon voice recognition software. A reasonable attempt at proof reading has been made to minimize errors.

## 2021-02-23 ENCOUNTER — Other Ambulatory Visit: Payer: Self-pay

## 2021-02-23 ENCOUNTER — Ambulatory Visit (INDEPENDENT_AMBULATORY_CARE_PROVIDER_SITE_OTHER): Payer: Medicaid Other | Admitting: Medical

## 2021-02-23 VITALS — BP 129/76 | HR 89 | Wt 152.0 lb

## 2021-02-23 DIAGNOSIS — O30041 Twin pregnancy, dichorionic/diamniotic, first trimester: Secondary | ICD-10-CM

## 2021-02-23 DIAGNOSIS — Z3A12 12 weeks gestation of pregnancy: Secondary | ICD-10-CM

## 2021-02-23 DIAGNOSIS — O0990 Supervision of high risk pregnancy, unspecified, unspecified trimester: Secondary | ICD-10-CM

## 2021-02-23 MED ORDER — BLOOD PRESSURE KIT DEVI: 1.0000 | Freq: Once | 0 refills | Status: AC

## 2021-02-23 MED ORDER — GOJJI WEIGHT SCALE MISC: 1.0000 | Freq: Once | 0 refills | Status: AC

## 2021-02-23 NOTE — Progress Notes (Signed)
° °  PRENATAL VISIT NOTE  Subjective:  Erin Harrison is a 26 y.o. G3P1011 at [redacted]w[redacted]d being seen today for her first prenatal visit for this pregnancy.  She is currently monitored for the following issues for this high-risk pregnancy and has Supervision of high risk pregnancy, antepartum and Twin pregnancy, twins dichorionic and diamniotic on their problem list.  Patient reports backache.  Contractions: Not present. Vag. Bleeding: None.  Movement: Absent. Denies leaking of fluid.   She is planning to both breast and bottle feed. Desires contraception, unsure method  The following portions of the patient's history were reviewed and updated as appropriate: allergies, current medications, past family history, past medical history, past social history, past surgical history and problem list.   Objective:   Vitals:   02/23/21 0913  BP: 129/76  Pulse: 89  Weight: 152 lb (68.9 kg)    Fetal Status: Fetal Heart Rate (bpm): 155/161   Movement: Absent     General:  Alert, oriented and cooperative. Patient is in no acute distress.  Skin: Skin is warm and dry. No rash noted.   Cardiovascular: Normal heart rate and rhythm noted  Respiratory: Normal respiratory effort, no problems with respiration noted. Clear to auscultation.   Abdomen: Soft, gravid, appropriate for gestational age. Normal bowel sounds. Non-tender. Pain/Pressure: Present     Pelvic: Cervical exam deferred        Extremities: Normal range of motion.  Edema: None  Mental Status: Normal mood and affect. Normal behavior. Normal judgment and thought content.   Assessment and Plan:  Pregnancy: G3P1011 at [redacted]w[redacted]d 1. Supervision of high risk pregnancy, antepartum - Blood Pressure Monitoring (BLOOD PRESSURE KIT) DEVI; 1 Device by Does not apply route once for 1 dose. Icd 10 dx: o09.90 high risk pregnancy (Patient not taking: Reported on 02/23/2021)  Dispense: 1 each; Refill: 0 - Misc. Devices (GOJJI WEIGHT SCALE) MISC; 1 Device by Does not  apply route once for 1 dose. Icd 10 dx: o09.90 high risk pregnancy (Patient not taking: Reported on 02/23/2021)  Dispense: 1 each; Refill: 0 - Culture, OB Urine - CBC/D/Plt+RPR+Rh+ABO+RubIgG... - Genetic Screening - HgB A1c  2. Dichorionic diamniotic twin pregnancy in first trimester - Discussed need for monthly growth [place] in second and third trimester  - Antenatal testing starting at 36 weeks as long as concordant growth - Plan for 38 week delivery  - Advised of increased risk for PTL with twins  - Discussed MOD and scenarios when SVD is safe or when C/S is indicated based on baby presentations  3. [redacted] weeks gestation of pregnancy  Patient is interested in CenteringPregnancy model of care for this pregnancy. Multiple gestation does not appear to be an exclusion factor for this model, will send a message to the facilitators to ensure.   Preterm labor/first trimester warning symptoms and general obstetric precautions including but not limited to vaginal bleeding, contractions, leaking of fluid and fetal movement were reviewed in detail with the patient. Please refer to After Visit Summary for other counseling recommendations.   Discussed the normal visit cadence for prenatal care Discussed the nature of our practice with multiple providers including residents and students   Return in about 4 weeks (around 03/23/2021) for [facility] MD only or [facility], In-Person.  Future Appointments  Date Time Provider [place]  04/16/2021  8:30 AM [facility] NURSE [facility]  04/16/2021  8:45 AM [facility] US5 [facility]    Kerry Hough, PA-C

## 2021-02-24 ENCOUNTER — Telehealth: Payer: Self-pay | Admitting: Radiology

## 2021-02-24 ENCOUNTER — Encounter: Payer: Self-pay | Admitting: Radiology

## 2021-02-24 LAB — CBC/D/PLT+RPR+RH+ABO+RUBIGG...
Antibody Screen: NEGATIVE
Basophils Absolute: 0 10*3/uL (ref 0.0–0.2)
Basos: 0 %
EOS (ABSOLUTE): 0 10*3/uL (ref 0.0–0.4)
Eos: 1 %
HCV Ab: <0.1 s/co ratio (ref 0.0–0.9)
HIV Screen 4th Generation wRfx: NONREACTIVE
Hematocrit: 37.9 % (ref 34.0–46.6)
Hemoglobin: 13.3 g/dL (ref 11.1–15.9)
Hepatitis B Surface Ag: NEGATIVE
Immature Grans (Abs): 0 10*3/uL (ref 0.0–0.1)
Immature Granulocytes: 0 %
Lymphocytes Absolute: 1.7 10*3/uL (ref 0.7–3.1)
Lymphs: 29 %
MCH: 31.2 pg (ref 26.6–33.0)
MCHC: 35.1 g/dL (ref 31.5–35.7)
MCV: 89 fL (ref 79–97)
Monocytes Absolute: 0.5 10*3/uL (ref 0.1–0.9)
Monocytes: 9 %
Neutrophils Absolute: 3.4 10*3/uL (ref 1.4–7.0)
Neutrophils: 61 %
Platelets: 242 10*3/uL (ref 150–450)
RBC: 4.26 x10E6/uL (ref 3.77–5.28)
RDW: 12.3 % (ref 11.7–15.4)
RPR Ser Ql: NONREACTIVE
Rh Factor: POSITIVE
Rubella Antibodies, IGG: 4.74 index (ref >0.99)
WBC: 5.6 10*3/uL (ref 3.4–10.8)

## 2021-02-24 LAB — HEMOGLOBIN A1C
Est. average glucose Bld gHb Est-mCnc: 105 mg/dL
Hgb A1c MFr Bld: 5.3 % (ref 4.8–5.6)

## 2021-02-24 LAB — HCV INTERPRETATION

## 2021-02-24 NOTE — Telephone Encounter (Signed)
Spoke with patient about completing accommodations form sent from her employer. Patient states that she does not need accommodations at this time. Form completed and faxed at patients request. No restrictions or accommodations.

## 2021-02-25 LAB — URINE CULTURE, OB REFLEX

## 2021-02-25 LAB — CULTURE, OB URINE

## 2021-02-26 ENCOUNTER — Encounter: Payer: Self-pay | Admitting: *Deleted

## 2021-02-28 ENCOUNTER — Telehealth: Payer: Self-pay | Admitting: Lactation Services

## 2021-02-28 NOTE — Telephone Encounter (Signed)
Called mom at request of Valentino Hue, Prenatal Navigator.   She reports she does not have any questions at this time. She reports she will need assistance once babies are born. Reviewed IP/OP Services and asking for help as needed.   Encouraged mom to order breast pump after 28 weeks of pregnancy by ordering on [email or web address]. My Chart message sent.   Patient voiced understanding and will call for any assistance or questions or concerns as needed.

## 2021-03-02 ENCOUNTER — Encounter (HOSPITAL_COMMUNITY): Payer: Self-pay | Admitting: Obstetrics & Gynecology

## 2021-03-02 ENCOUNTER — Other Ambulatory Visit: Payer: Self-pay

## 2021-03-02 ENCOUNTER — Inpatient Hospital Stay (HOSPITAL_COMMUNITY): Payer: Medicaid Other

## 2021-03-02 ENCOUNTER — Inpatient Hospital Stay (HOSPITAL_COMMUNITY)
Admission: AD | Admit: 2021-03-02 | Discharge: 2021-03-02 | Disposition: A | Discharge status: Home / Self Care | Payer: Medicaid Other | Attending: Obstetrics & Gynecology | Admitting: Obstetrics & Gynecology

## 2021-03-02 DIAGNOSIS — Z3A13 13 weeks gestation of pregnancy: Secondary | ICD-10-CM | POA: Diagnosis not present

## 2021-03-02 DIAGNOSIS — O30002 Twin pregnancy, unspecified number of placenta and unspecified number of amniotic sacs, second trimester: Secondary | ICD-10-CM

## 2021-03-02 DIAGNOSIS — O0991 Supervision of high risk pregnancy, unspecified, first trimester: Secondary | ICD-10-CM | POA: Insufficient documentation

## 2021-03-02 DIAGNOSIS — O30041 Twin pregnancy, dichorionic/diamniotic, first trimester: Secondary | ICD-10-CM | POA: Diagnosis not present

## 2021-03-02 DIAGNOSIS — O26891 Other specified pregnancy related conditions, first trimester: Secondary | ICD-10-CM | POA: Insufficient documentation

## 2021-03-02 DIAGNOSIS — R102 Pelvic and perineal pain: Secondary | ICD-10-CM | POA: Insufficient documentation

## 2021-03-02 DIAGNOSIS — O4692 Antepartum hemorrhage, unspecified, second trimester: Secondary | ICD-10-CM | POA: Diagnosis not present

## 2021-03-02 DIAGNOSIS — O0990 Supervision of high risk pregnancy, unspecified, unspecified trimester: Secondary | ICD-10-CM

## 2021-03-02 DIAGNOSIS — O26851 Spotting complicating pregnancy, first trimester: Secondary | ICD-10-CM | POA: Insufficient documentation

## 2021-03-02 DIAGNOSIS — O209 Hemorrhage in early pregnancy, unspecified: Secondary | ICD-10-CM

## 2021-03-02 DIAGNOSIS — O30042 Twin pregnancy, dichorionic/diamniotic, second trimester: Secondary | ICD-10-CM

## 2021-03-02 NOTE — MAU Note (Signed)
PT SAYS CRAMPING STARTED 930AM- WORSE NOW- 2 VB STARTED AT 2300- IN UNDERWEAR 2330- WIPED AGAIN - NONE  AND STILL NONE  LAST SEX- 2 WEEKS AGO

## 2021-03-02 NOTE — MAU Provider Note (Signed)
Chief Complaint: Abdominal Pain and Vaginal Bleeding   Event Date/Time   First Provider Initiated Contact with Patient 03/02/21 0039        SUBJECTIVE HPI: Erin Harrison is a 26 y.o. G3P1011 at [redacted]w[redacted]d by LMP who presents to maternity admissions reporting pelvic cramping since morning with development of vaginal bleeding tonight.  Has only seen it twice.  No recent IC. She denies vaginal itching/burning, urinary symptoms, h/a, dizziness, n/v, or fever/chills.    Abdominal Pain This is a new problem. The current episode started yesterday. The onset quality is gradual. The problem occurs intermittently. The problem has been waxing and waning. The quality of the pain is cramping. The abdominal pain does not radiate. Pertinent negatives include no constipation, diarrhea or fever. Nothing aggravates the pain. The pain is relieved by Nothing. She has tried nothing for the symptoms.  Vaginal Bleeding The patient's primary symptoms include pelvic pain and vaginal bleeding. The patient's pertinent negatives include no genital itching, genital lesions or genital odor. This is a new problem. The current episode started today. The pain is mild. She is pregnant. Associated symptoms include abdominal pain. Pertinent negatives include no constipation, diarrhea or fever. The vaginal discharge was bloody. The vaginal bleeding is spotting. She has not been passing clots. She has not been passing tissue. Nothing aggravates the symptoms. She has tried nothing for the symptoms.   RN Note: PT SAYS CRAMPING STARTED 930AM- WORSE NOW- 2 VB STARTED AT 2300- IN UNDERWEAR 2330- WIPED AGAIN - NONE  AND STILL NONE  LAST SEX- 2 WEEKS AGO  Past Medical History:  Diagnosis Date   Chlamydia    Headache    Hypoglycemia    Infection    UTI   Past Surgical History:  Procedure Laterality Date   DILATION AND CURETTAGE OF UTERUS     WISDOM TOOTH EXTRACTION     Social History   Socioeconomic History   Marital status:  Single    Spouse name: Not on file   Number of children: Not on file   Years of education: Not on file   Highest education level: Not on file  Occupational History   Not on file  Tobacco Use   Smoking status: Never   Smokeless tobacco: Never  Vaping Use   Vaping Use: Never used  Substance and Sexual Activity   Alcohol use: Not Currently    Comment: occ   Drug use: No   Sexual activity: Yes    Birth control/protection: None  Other Topics Concern   Not on file  Social History Narrative   Not on file   Social Determinants of Health   Financial Resource Strain: Not on file  Food Insecurity: No Food Insecurity   Worried About Running Out of Food in the Last Year: Never true   Ran Out of Food in the Last Year: Never true  Transportation Needs: No Transportation Needs   Lack of Transportation (Medical): No   Lack of Transportation (Non-Medical): No  Physical Activity: Not on file  Stress: Not on file  Social Connections: Not on file  Intimate Partner Violence: Not on file   No current facility-administered medications on file prior to encounter.   Current Outpatient Medications on File Prior to Encounter  Medication Sig Dispense Refill   acetaminophen (TYLENOL) 500 MG tablet Take 2 tablets (1,000 mg total) by mouth every 6 (six) hours as needed. 30 tablet 3   prenatal vitamin w/FE, FA (PRENATAL 1 + 1) 27-1 MG TABS tablet  Take 1 tablet by mouth daily at 12 noon.     cyclobenzaprine (FLEXERIL) 10 MG tablet Take 1 tablet (10 mg total) by mouth every 8 (eight) hours as needed for muscle spasms. (Patient not taking: Reported on 02/23/2021) 30 tablet 1   Doxylamine-Pyridoxine 10-10 MG TBEC Take 2 tablets by mouth at bedtime. 90 tablet 2   No Known Allergies  I have reviewed patient's Past Medical Hx, Surgical Hx, Family Hx, Social Hx, medications and allergies.   ROS:  Review of Systems  Constitutional:  Negative for fever.  Gastrointestinal:  Positive for abdominal pain.  Negative for constipation and diarrhea.  Genitourinary:  Positive for pelvic pain and vaginal bleeding.  Review of Systems  Other systems negative   Physical Exam  Physical Exam Patient Vitals for the past 24 hrs:  BP Temp Temp src Pulse Resp Height Weight  03/02/21 0018 116/65 98.4 F (36.9 C) Oral 87 18 5\' 2"  (1.575 m) 69.1 kg   Constitutional: Well-developed, well-nourished female in no acute distress.  Cardiovascular: normal rate Respiratory: normal effort GI: Abd soft, non-tender MS: Extremities nontender, no edema, normal ROM Neurologic: Alert and oriented x 4.  GU: Neg CVAT.  PELVIC EXAM: Cervix closed and long.  Small amount of pink tinged discharge.  No lesions.  No effacement seen.   LAB RESULTS  B/Positive/-- (01/27 1021)  IMAGING US OB Comp Less 14 Wks  Result Date: 03/02/2021 CLINICAL DATA:  Pregnant, vaginal bleeding EXAM: TWIN OBSTETRICAL ULTRASOUND <14 WKS TECHNIQUE: Transabdominal ultrasound was performed for evaluation of the gestation as well as the maternal uterus and adnexal regions. COMPARISON:  None. FINDINGS: Number of IUPs:  2 Chorionicity/Amnionicity:  Dichorionic-diamniotic (thick membrane) TWIN 1 Yolk sac:  Not Visualized. Embryo:  Visualized. Cardiac Activity: Visualized. Heart Rate: 171 bpm CRL:   69.4 mm   13 w 1 d                  US EDC: 09/06/2021 TWIN 2 Yolk sac:  Not Visualized. Embryo:  Visualized. Cardiac Activity: Visualized. Heart Rate: 149 bpm CRL:   68.0 mm   13 w 0 d                  US EDC: 09/07/2021 Subchorionic hemorrhage:  None visualized. Maternal uterus/adnexae: Bilateral ovaries are within normal limits. No free fluid. IMPRESSION: Twin intrauterine gestations with cardiac activity, measuring 13 weeks 1 day by crown-rump length, as above. Electronically Signed   By: Charline BillsSriyesh  Krishnan M.D.   On: 03/02/2021 01:17   US OB Comp AddL Gest Less 14 Wks  Result Date: 03/02/2021 CLINICAL DATA:  Pregnant, vaginal bleeding EXAM: TWIN OBSTETRICAL  ULTRASOUND <14 WKS TECHNIQUE: Transabdominal ultrasound was performed for evaluation of the gestation as well as the maternal uterus and adnexal regions. COMPARISON:  None. FINDINGS: Number of IUPs:  2 Chorionicity/Amnionicity:  Dichorionic-diamniotic (thick membrane) TWIN 1 Yolk sac:  Not Visualized. Embryo:  Visualized. Cardiac Activity: Visualized. Heart Rate: 171 bpm CRL:   69.4 mm   13 w 1 d                  US EDC: 09/06/2021 TWIN 2 Yolk sac:  Not Visualized. Embryo:  Visualized. Cardiac Activity: Visualized. Heart Rate: 149 bpm CRL:   68.0 mm   13 w 0 d                  US EDC: 09/07/2021 Subchorionic hemorrhage:  None visualized. Maternal uterus/adnexae: Bilateral ovaries are within  normal limits. No free fluid. IMPRESSION: Twin intrauterine gestations with cardiac activity, measuring 13 weeks 1 day by crown-rump length, as above. Electronically Signed   By: Charline Bills M.D.   On: 03/02/2021 01:17     MAU Management/MDM: Ordered Ultrasound to rule out subchorionic hemorrhage, this is not seen. Pelvic exam,  pink tinged discharge, no obvious source of bleeding Recommend pelvic rest and observation over time  ASSESSMENT 1. Supervision of high risk pregnancy, antepartum   2. Dichorionic diamniotic twin pregnancy in first trimester   3.     Vaginal bleeding  PLAN Discharge home Pelvic rest Followup in office  Pt stable at time of discharge. Encouraged to return here if she develops worsening of symptoms, increase in pain, fever, or other concerning symptoms.    Wynelle Bourgeois CNM, MSN Certified Nurse-Midwife 03/02/2021  12:39 AM

## 2021-03-03 ENCOUNTER — Encounter: Payer: Self-pay | Admitting: Radiology

## 2021-03-11 NOTE — Progress Notes (Deleted)
° ° ° ° ° °  PRENATAL VISIT NOTE- Centering Pregnancy Cycle 2, Session # 1  Subjective:  Erin Harrison is a 26 y.o. G3P1011 at [redacted]w[redacted]d being seen today for ongoing prenatal care through Centering Pregnancy.  She is currently monitored for the following issues for this high-risk pregnancy and has Supervision of high risk pregnancy, antepartum and Twin pregnancy, twins dichorionic and diamniotic on their problem list.  Patient reports {sx:14538}.   .  .   . ***Denies leaking of fluid/ROM.   The following portions of the patient's history were reviewed and updated as appropriate: allergies, current medications, past family history, past medical history, past social history, past surgical history and problem list. Problem list updated.  Objective:  There were no vitals filed for this visit.  Fetal Status:           General:  Alert, oriented and cooperative. Patient is in no acute distress.  Skin: Skin is warm and dry. No rash noted.   Cardiovascular: Normal heart rate noted  Respiratory: Normal respiratory effort, no problems with respiration noted  Abdomen: Soft, gravid, appropriate for gestational age.        Pelvic: {Blank single:19197::"Cervical exam performed","Cervical exam deferred"}        Extremities: Normal range of motion.     Mental Status: Normal mood and affect. Normal behavior. Normal judgment and thought content.   Assessment and Plan:  Pregnancy: G3P1011 at [redacted]w[redacted]d  1. Supervision of high risk pregnancy, antepartum   Centering Pregnancy, Session#1: Introduction to model of care. Group determined rules for self-governance and closing phrase. Oriented group to space and mother's notebook.   Facilitated discussion today:  common discomforts, When to call practice  Mindfulness activity completed as well as introduction to deep breathing for childbirth preparation- Centering 3 Breaths  Fundal height and FHR appropriate today unless noted otherwise in plan of care. Patient to  continue group care.   --Pregnancy 12-16 weeks: NIPS: pending Horizon: pending AFP: Needs--order with ultrasound Ultrasound:  scheduled 04/16/21  2. Dichorionic diamniotic twin pregnancy in first trimester ***     {Blank single:19197::"Term","Preterm"} labor symptoms and general obstetric precautions including but not limited to vaginal bleeding, contractions, leaking of fluid and fetal movement were reviewed in detail with the patient. Please refer to After Visit Summary for other counseling recommendations.  No follow-ups on file.  Future Appointments  Date Time Provider Department Center  03/12/2021  9:00 AM CENTERING PROVIDER [facility]  03/23/2021 11:15 AM Kathlene Cote [facility]  04/09/2021  9:00 AM CENTERING PROVIDER [facility]  04/16/2021  8:30 AM [facility] NURSE [facility]  04/16/2021  8:45 AM [facility] US5 [facility]  05/07/2021  9:00 AM CENTERING PROVIDER [facility]  06/04/2021  9:00 AM CENTERING PROVIDER [facility]  06/18/2021  9:00 AM CENTERING PROVIDER [facility]  07/02/2021  9:00 AM CENTERING PROVIDER [facility]  07/16/2021  9:00 AM CENTERING PROVIDER [facility]  07/30/2021  9:00 AM CENTERING PROVIDER [facility]  08/13/2021  9:00 AM CENTERING PROVIDER [facility]  08/27/2021  9:00 AM CENTERING PROVIDER [facility]    Sharen Counter, CNM

## 2021-03-12 ENCOUNTER — Encounter: Payer: Medicaid Other | Admitting: Advanced Practice Midwife

## 2021-03-12 ENCOUNTER — Telehealth: Payer: Self-pay

## 2021-03-12 DIAGNOSIS — O30041 Twin pregnancy, dichorionic/diamniotic, first trimester: Secondary | ICD-10-CM

## 2021-03-12 DIAGNOSIS — O0990 Supervision of high risk pregnancy, unspecified, unspecified trimester: Secondary | ICD-10-CM

## 2021-03-12 NOTE — Telephone Encounter (Signed)
Called pt and pt informed me that she was unable to come to visit today because her daughter had to be home from [place] and could not come.  Pt asks when she will be able to bring visitor as her partner wanted to be part of her visit.  I explained to the pt that due to COVID we could not give a definite answer to that.  Pt requests that she put into traditional pregnancy care and that she will just come to her appt that was scheduled on 03/23/21.  I informed pt that I would cancel her Centering Pregnancy appts and her traditional appts will be scheduled following her visit on 03/23/21.  Pt verbalized understanding.   Frances Nickels  03/12/21

## 2021-03-20 ENCOUNTER — Encounter: Payer: Self-pay | Admitting: *Deleted

## 2021-03-23 ENCOUNTER — Ambulatory Visit (INDEPENDENT_AMBULATORY_CARE_PROVIDER_SITE_OTHER): Payer: Medicaid Other | Admitting: Medical

## 2021-03-23 ENCOUNTER — Encounter: Payer: Self-pay | Admitting: Medical

## 2021-03-23 ENCOUNTER — Other Ambulatory Visit: Payer: Self-pay

## 2021-03-23 VITALS — BP 120/68 | HR 90 | Wt 156.7 lb

## 2021-03-23 DIAGNOSIS — O30042 Twin pregnancy, dichorionic/diamniotic, second trimester: Secondary | ICD-10-CM

## 2021-03-23 DIAGNOSIS — O0990 Supervision of high risk pregnancy, unspecified, unspecified trimester: Secondary | ICD-10-CM

## 2021-03-23 DIAGNOSIS — M5431 Sciatica, right side: Secondary | ICD-10-CM

## 2021-03-23 DIAGNOSIS — Z3A16 16 weeks gestation of pregnancy: Secondary | ICD-10-CM

## 2021-03-23 NOTE — Progress Notes (Signed)
° °  PRENATAL VISIT NOTE  Subjective:  Erin Harrison is a 26 y.o. G3P1011 at [redacted]w[redacted]d being seen today for ongoing prenatal care.  She is currently monitored for the following issues for this high-risk pregnancy and has Supervision of high risk pregnancy, antepartum and Twin pregnancy, twins dichorionic and diamniotic on their problem list.  Patient reports backache. Right-sided sciatic nerve pain noted, especially with standing.  Contractions: Not present. Vag. Bleeding: None.  Movement: Present. Denies leaking of fluid.   The following portions of the patient's history were reviewed and updated as appropriate: allergies, current medications, past family history, past medical history, past social history, past surgical history and problem list.   Objective:   Vitals:   03/23/21 1125  BP: 120/68  Pulse: 90  Weight: 156 lb 11.2 oz (71.1 kg)    Fetal Status: Fetal Heart Rate (bpm): 150/146   Movement: Present     General:  Alert, oriented and cooperative. Patient is in no acute distress.  Skin: Skin is warm and dry. No rash noted.   Cardiovascular: Normal heart rate noted  Respiratory: Normal respiratory effort, no problems with respiration noted  Abdomen: Soft, gravid, appropriate for gestational age.  Pain/Pressure: Present     Pelvic: Cervical exam deferred        Extremities: Normal range of motion.  Edema: None  Mental Status: Normal mood and affect. Normal behavior. Normal judgment and thought content.   Assessment and Plan:  Pregnancy: G3P1011 at [redacted]w[redacted]d 1. Supervision of high risk pregnancy, antepartum - Still unsure about MOC - Confirmed circumcision desired for female twin - Anatomy US scheduled 04/16/21  2. Dichorionic diamniotic twin pregnancy in second trimester - Discussed plan for growth [place], antenatal testing and delivery timing   3. Sciatic nerve pain, right - Discussed maternity belt, ice, hydrotherapy and Tylenol   4. [redacted] weeks gestation of pregnancy  Preterm  labor symptoms and general obstetric precautions including but not limited to vaginal bleeding, contractions, leaking of fluid and fetal movement were reviewed in detail with the patient. Please refer to After Visit Summary for other counseling recommendations.   Return in about 4 weeks (around 04/20/2021) for Alexis Frock [facility] MD only or Almyra Free .  Future Appointments  Date Time Provider [place]  04/16/2021  8:30 AM [facility] NURSE [facility]  04/16/2021  8:45 AM [facility] US5 [facility]  04/20/2021 10:55 AM Constant, Vickii Chafe, MD [facility]    Kerry Hough, PA-C

## 2021-04-16 ENCOUNTER — Other Ambulatory Visit: Payer: Self-pay

## 2021-04-16 ENCOUNTER — Other Ambulatory Visit: Payer: Self-pay | Admitting: *Deleted

## 2021-04-16 ENCOUNTER — Encounter: Payer: Self-pay | Admitting: *Deleted

## 2021-04-16 ENCOUNTER — Ambulatory Visit: Payer: Medicaid Other | Admitting: *Deleted

## 2021-04-16 ENCOUNTER — Ambulatory Visit: Payer: Medicaid Other | Attending: Medical | Admitting: Obstetrics and Gynecology

## 2021-04-16 ENCOUNTER — Ambulatory Visit: Payer: Medicaid Other | Attending: Medical

## 2021-04-16 VITALS — BP 120/58 | HR 73

## 2021-04-16 DIAGNOSIS — O0990 Supervision of high risk pregnancy, unspecified, unspecified trimester: Secondary | ICD-10-CM

## 2021-04-16 DIAGNOSIS — Z3A19 19 weeks gestation of pregnancy: Secondary | ICD-10-CM | POA: Diagnosis not present

## 2021-04-16 DIAGNOSIS — O30042 Twin pregnancy, dichorionic/diamniotic, second trimester: Secondary | ICD-10-CM

## 2021-04-16 DIAGNOSIS — O358XX0 Maternal care for other (suspected) fetal abnormality and damage, not applicable or unspecified: Secondary | ICD-10-CM

## 2021-04-16 DIAGNOSIS — O35DXX1 Maternal care for other (suspected) fetal abnormality and damage, fetal gastrointestinal anomalies, fetus 1: Secondary | ICD-10-CM

## 2021-04-16 DIAGNOSIS — O43192 Other malformation of placenta, second trimester: Secondary | ICD-10-CM

## 2021-04-16 DIAGNOSIS — O30041 Twin pregnancy, dichorionic/diamniotic, first trimester: Secondary | ICD-10-CM | POA: Diagnosis present

## 2021-04-16 DIAGNOSIS — O283 Abnormal ultrasonic finding on antenatal screening of mother: Secondary | ICD-10-CM

## 2021-04-16 NOTE — Progress Notes (Signed)
Maternal-Fetal Medicine  ? ?Name: Erin Harrison ?DOB: [birth year] ?MRN: [id number] ?Referring Provider: Vonzella Nipple, PA ? ?I had the pleasure of seeing Erin [facility] at [facility]. She is G3 P1011 at 19w 4d gestation with twins and is here for fetal anatomy scan. ?On cell-free fetal DNA screening, the risks of fetal aneuploidies are not increased. ?Past medical history: No history of diabetes or hypertension or any chronic medical conditions. ?Past surgical history: Nil of note. ?Medications: Prenatal vitamins. ?Allergies: No known drug allergies. ?Social history: Denies tobacco or drug or alcohol use.  She is with a partner who is not the father of her first child.  He is [place] [employer] architect and he is in good health. ?Family history: No history of venous thromboembolism in the family. ?GYN history: No history of abnormal Pap smears or cervical surgeries. ?Obstetric history significant for a term vaginal delivery in 2019 of a female infant weighing 7 pounds and 14 ounces at birth. ? ?Ultrasound ?Dichorionic-diamniotic twin pregnancy. ? ?Twin A: Lower fetus, variable presentation, anterior placenta, female fetus.  Amniotic fluid is normal and good fetal activity seen.  Fetal biometry is consistent with early ultrasound dating. An echogenic intracardiac focus is seen.  No other markers of aneuploidies or fetal structural defects are seen.  Marginal cord insertion is seen.   ? ?Twin B: Upper fetus, variable presentation, anterior placenta, female fetus. Amniotic fluid is normal and good fetal activity seen.  Fetal biometry is consistent with early ultrasound dating. No other markers of aneuploidies or fetal structural defects are seen. ? ?Growth discordancy: 2%. ? ?Pregnancy dating ?I reviewed the ultrasound report.  Pregnancy should be dated by 13-week ultrasound performed on 03/02/2021.  Her 5-week ultrasound measured only the gestational sac. ?We have assigned her EDD at  09/06/2021. ? ?Dichorionic-diamniotic twin pregnancy ?I explained the significance of chorionicity and its implications. Possible complications associated with twin pregnancy include preterm labor/delivery (most common), fetal growth restriction of one or both twins, malpresentations and increased cesarean delivery rate, postpartum hemorrhage. I also informed her that maternal complications including gestational diabetes and gestational hypertension/preeclampsia are more common. ?I discussed the mode of delivery that is based on the presentations.  If both have Vx/Vx or Vx/non-vertex presentations, vaginal delivery may be considered. In Vx/non-vx, vaginal delivery followed by internal podalic version of second twin will achieve vaginal delivery. In non-vx first twin presentation, cesarean delivery will be performed. ? ?I emphasized the importance of weight gain (24 lbs by 24 weeks) to improve fetal weight and decrease the chances of preterm delivery. ?Low-dose aspirin is beneficial in preventing preeclampsia. Twin pregnancy is at high risk for preeclampsia. ?I recommended aspirin (81 mg daily) to be started from now until delivery. She does not have contraindications to aspirin. ? ?Echogenic intracardiac focus (Twin A) ?I explained that echogenic intracardiac focus is a marker for Down syndrome.  Given that she has low risk for Down syndrome on cell free fetal DNA screening, this should be considered a normal variant.  I do not recommend amniocentesis to confirm fetal Down syndrome. ? ?Marginal cord insertion ?I explained the finding with help of diagrams.  Marginal cord insertion can be associated with fetal growth restriction in few cases.  Patient will be having serial fetal growth assessments every 4 weeks because of twin pregnancy. ? ?Recommendations ?-Low-dose aspirin (81 mg) from now till delivery. ?-An appointment was made for her to return in 4 weeks for her completion of fetal anatomy. ?-Fetal growth  assessments every 4 weeks. ?-BPP at 13- and 37-weeks' gestation. ?-Delivery at [redacted] weeks gestation. ? ?Thank you for consultation.  If you have any questions or concerns, please contact me the [facility].  Consultation including face-to-face (more than 50%) counseling 45 minutes. ? ?

## 2021-04-17 DIAGNOSIS — O43199 Other malformation of placenta, unspecified trimester: Secondary | ICD-10-CM | POA: Insufficient documentation

## 2021-04-20 ENCOUNTER — Ambulatory Visit (INDEPENDENT_AMBULATORY_CARE_PROVIDER_SITE_OTHER): Payer: Medicaid Other | Admitting: Obstetrics and Gynecology

## 2021-04-20 ENCOUNTER — Other Ambulatory Visit: Payer: Self-pay

## 2021-04-20 ENCOUNTER — Encounter: Payer: Self-pay | Admitting: Obstetrics and Gynecology

## 2021-04-20 VITALS — BP 107/78 | Wt 163.0 lb

## 2021-04-20 DIAGNOSIS — O30042 Twin pregnancy, dichorionic/diamniotic, second trimester: Secondary | ICD-10-CM

## 2021-04-20 DIAGNOSIS — O0990 Supervision of high risk pregnancy, unspecified, unspecified trimester: Secondary | ICD-10-CM

## 2021-04-20 DIAGNOSIS — O43199 Other malformation of placenta, unspecified trimester: Secondary | ICD-10-CM

## 2021-04-20 NOTE — Progress Notes (Signed)
No vb. No lof. Anatomy US this past Monday. ?

## 2021-04-20 NOTE — Progress Notes (Signed)
? ?  PRENATAL VISIT NOTE ? ?Subjective:  ?Erin Harrison is a 26 y.o. G3P1011 at [redacted]w[redacted]d being seen today for ongoing prenatal care.  She is currently monitored for the following issues for this high-risk pregnancy and has Supervision of high risk pregnancy, antepartum; Twin pregnancy, twins dichorionic and diamniotic; and Marginal insertion of umbilical cord affecting management of mother on their problem list. ? ?Patient reports no complaints.  Contractions: Not present. Vag. Bleeding: None.   . Denies leaking of fluid.  ? ?The following portions of the patient's history were reviewed and updated as appropriate: allergies, current medications, past family history, past medical history, past social history, past surgical history and problem list.  ? ?Objective:  ? ?Vitals:  ? 04/20/21 1105  ?BP: 107/78  ?Weight: 163 lb (73.9 kg)  ? ? ?Fetal Status: Fetal Heart Rate (bpm): 150/140        ? ?General:  Alert, oriented and cooperative. Patient is in no acute distress.  ?Skin: Skin is warm and dry. No rash noted.   ?Cardiovascular: Normal heart rate noted  ?Respiratory: Normal respiratory effort, no problems with respiration noted  ?Abdomen: Soft, gravid, appropriate for gestational age.  Pain/Pressure: Absent     ?Pelvic: Cervical exam deferred        ?Extremities: Normal range of motion.     ?Mental Status: Normal mood and affect. Normal behavior. Normal judgment and thought content.  ? ?Assessment and Plan:  ?Pregnancy: G3P1011 at [redacted]w[redacted]d ?1. Supervision of high risk pregnancy, antepartum ?Patient is doing well without complaints ?Patient declined AFP ? ?2. Dichorionic diamniotic twin pregnancy in second trimester ?Normal ultrasound ?Follow up growth in April ? ?3. Marginal insertion of umbilical cord affecting management of mother ?Follow up ultrasound ? ?Preterm labor symptoms and general obstetric precautions including but not limited to vaginal bleeding, contractions, leaking of fluid and fetal movement were  reviewed in detail with the patient. ?Please refer to After Visit Summary for other counseling recommendations.  ? ?Return in about 4 weeks (around 05/18/2021) for in person, ROB, High risk. ? ?Future Appointments  ?Date Time Provider [place]  ?05/18/2021  8:30 AM [facility] NURSE [facility]  ?05/18/2021  8:45 AM [facility] US5 [facility]  ? ? ?Mora Bellman, MD ? ?

## 2021-05-12 ENCOUNTER — Telehealth: Payer: Medicaid Other | Admitting: Family Medicine

## 2021-05-12 DIAGNOSIS — B3731 Acute candidiasis of vulva and vagina: Secondary | ICD-10-CM

## 2021-05-12 MED ORDER — FLUCONAZOLE 150 MG PO TABS: 150.0000 mg | ORAL_TABLET | Freq: Once | ORAL | 0 refills | Status: AC

## 2021-05-12 NOTE — Patient Instructions (Signed)

## 2021-05-12 NOTE — Progress Notes (Signed)
?Virtual Visit Consent  ? ?Erin Harrison, you are scheduled for a virtual visit with a [facility] provider today.   ?  ?Just as with appointments in the office, your consent must be obtained to participate.  Your consent will be active for this visit and any virtual visit you may have with one of our providers in the next 365 days.   ?  ?If you have a MyChart account, a copy of this consent can be sent to you electronically.  All virtual visits are billed to your insurance company just like a traditional visit in the office.   ? ?As this is a virtual visit, video technology does not allow for your provider to perform a traditional examination.  This may limit your provider's ability to fully assess your condition.  If your provider identifies any concerns that need to be evaluated in person or the need to arrange testing (such as labs, EKG, etc.), we will make arrangements to do so.   ?  ?Although advances in technology are sophisticated, we cannot ensure that it will always work on either your end or our end.  If the connection with a video visit is poor, the visit may have to be switched to a telephone visit.  With either a video or telephone visit, we are not always able to ensure that we have a secure connection.    ? ?I need to obtain your verbal consent now.   Are you willing to proceed with your visit today?  ?  ?Erin Harrison has provided verbal consent on 05/12/2021 for a virtual visit (video or telephone). ?  ?Dellia Nims, FNP  ? ?Date: 05/12/2021 11:49 AM ? ? ?Virtual Visit via Video Note  ? ?IDellia Nims, connected with  Analiese Demartini  ([id number], 1995-03-31) on 05/12/21 at 11:45 AM EDT by a video-enabled telemedicine application and verified that I am speaking with the correct person using two identifiers. ? ?Location: ?Patient: Virtual Visit Location Patient: Home ?Provider: Virtual Visit Location Provider: Home Office ?  ?I discussed the limitations of evaluation and management by  telemedicine and the availability of in person appointments. The patient expressed understanding and agreed to proceed.   ? ?History of Present Illness: ?Erin Harrison is a 26 y.o. who identifies as a female who was assigned female at birth, and is being seen today for vaginal discharge thick and white with no odor and has itching. . ? ?HPI: HPI  ?Problems:  ?Patient Active Problem List  ? Diagnosis Date Noted  ? Marginal insertion of umbilical cord affecting management of mother 04/17/2021  ? Supervision of high risk pregnancy, antepartum 02/13/2021  ? Twin pregnancy, twins dichorionic and diamniotic 02/13/2021  ?  ?Allergies: No Known Allergies ?Medications:  ?Current Outpatient Medications:  ?  fluconazole (DIFLUCAN) 150 MG tablet, Take 1 tablet (150 mg total) by mouth once for 1 dose., Disp: 1 tablet, Rfl: 0 ?  acetaminophen (TYLENOL) 500 MG tablet, Take 2 tablets (1,000 mg total) by mouth every 6 (six) hours as needed., Disp: 30 tablet, Rfl: 3 ?  cyclobenzaprine (FLEXERIL) 10 MG tablet, Take 1 tablet (10 mg total) by mouth every 8 (eight) hours as needed for muscle spasms. (Patient not taking: Reported on 02/23/2021), Disp: 30 tablet, Rfl: 1 ?  Doxylamine-Pyridoxine 10-10 MG TBEC, Take 2 tablets by mouth at bedtime. (Patient not taking: Reported on 03/23/2021), Disp: 90 tablet, Rfl: 2 ?  prenatal vitamin w/FE, FA (PRENATAL 1 + 1) 27-1 MG TABS tablet, Take 1  tablet by mouth daily at 12 noon., Disp: , Rfl:  ? ?Observations/Objective: ?Patient is well-developed, well-nourished in no acute distress.  ?Resting comfortably  at home.  ?Head is normocephalic, atraumatic.  ?No labored breathing.  ?Speech is clear and coherent with logical content.  ?Patient is alert and oriented at baseline.  ? ? ?Assessment and Plan: ?1. Vaginal candidiasis ? ?Keep clean and dry, med use and side effects discussed, follow up with GYN as needed.  ? ?Follow Up Instructions: ?I discussed the assessment and treatment plan with the  patient. The patient was provided an opportunity to ask questions and all were answered. The patient agreed with the plan and demonstrated an understanding of the instructions.  A copy of instructions were sent to the patient via MyChart unless otherwise noted below.  ? ? ? ?The patient was advised to call back or seek an in-person evaluation if the symptoms worsen or if the condition fails to improve as anticipated. ? ?Time:  ?I spent 8 minutes with the patient via telehealth technology discussing the above problems/concerns.   ? ?Dellia Nims, FNP  ?

## 2021-05-18 ENCOUNTER — Ambulatory Visit (INDEPENDENT_AMBULATORY_CARE_PROVIDER_SITE_OTHER): Payer: Medicaid Other | Admitting: Obstetrics and Gynecology

## 2021-05-18 ENCOUNTER — Ambulatory Visit: Payer: Medicaid Other

## 2021-05-18 ENCOUNTER — Ambulatory Visit: Payer: Medicaid Other | Admitting: *Deleted

## 2021-05-18 ENCOUNTER — Ambulatory Visit: Payer: Medicaid Other | Attending: Obstetrics and Gynecology

## 2021-05-18 ENCOUNTER — Encounter: Payer: Self-pay | Admitting: *Deleted

## 2021-05-18 ENCOUNTER — Other Ambulatory Visit: Payer: Self-pay | Admitting: *Deleted

## 2021-05-18 VITALS — BP 117/77 | HR 92 | Wt 168.2 lb

## 2021-05-18 VITALS — BP 119/69 | HR 74

## 2021-05-18 DIAGNOSIS — O0990 Supervision of high risk pregnancy, unspecified, unspecified trimester: Secondary | ICD-10-CM

## 2021-05-18 DIAGNOSIS — O43192 Other malformation of placenta, second trimester: Secondary | ICD-10-CM | POA: Diagnosis not present

## 2021-05-18 DIAGNOSIS — Z3A24 24 weeks gestation of pregnancy: Secondary | ICD-10-CM | POA: Diagnosis not present

## 2021-05-18 DIAGNOSIS — O30042 Twin pregnancy, dichorionic/diamniotic, second trimester: Secondary | ICD-10-CM

## 2021-05-18 DIAGNOSIS — O283 Abnormal ultrasonic finding on antenatal screening of mother: Secondary | ICD-10-CM

## 2021-05-18 DIAGNOSIS — O43199 Other malformation of placenta, unspecified trimester: Secondary | ICD-10-CM

## 2021-05-18 NOTE — Progress Notes (Signed)
? ?  PRENATAL VISIT NOTE ? ?Subjective:  ?Erin Harrison is a 26 y.o. G3P1011 at [redacted]w[redacted]d being seen today for ongoing prenatal care.  She is currently monitored for the following issues for this high-risk pregnancy and has Supervision of high risk pregnancy, antepartum; Twin pregnancy, twins dichorionic and diamniotic; and Marginal insertion of umbilical cord affecting management of mother on their problem list. ? ?Patient reports no complaints.  Contractions: Not present. Vag. Bleeding: None.  Movement: Present. Denies leaking of fluid.  ? ?The following portions of the patient's history were reviewed and updated as appropriate: allergies, current medications, past family history, past medical history, past social history, past surgical history and problem list.  ? ?Objective:  ? ?Vitals:  ? 05/18/21 0959  ?BP: 117/77  ?Pulse: 92  ?Weight: 168 lb 3.2 oz (76.3 kg)  ? ? ?Fetal Status: Fetal Heart Rate (bpm): 154/134   Movement: Present    ? ?General:  Alert, oriented and cooperative. Patient is in no acute distress.  ?Skin: Skin is warm and dry. No rash noted.   ?Cardiovascular: Normal heart rate noted  ?Respiratory: Normal respiratory effort, no problems with respiration noted  ?Abdomen: Soft, gravid, appropriate for gestational age.  Pain/Pressure: Present     ?Pelvic: Cervical exam deferred        ?Extremities: Normal range of motion.  Edema: Trace  ?Mental Status: Normal mood and affect. Normal behavior. Normal judgment and thought content.  ? ?Assessment and Plan:  ?Pregnancy: G3P1011 at [redacted]w[redacted]d ?1. [redacted] weeks gestation of pregnancy ?GTT next visit ? ?2. Marginal insertion of umbilical cord affecting management of mother ? ?3. Supervision of high risk pregnancy, antepartum ? ?4. Dichorionic diamniotic twin pregnancy in second trimester ?Normal growth u/s today. Tentatively d/w her re: delivery mode and as long as baby A is cephalic and antenatal testing continues to be reassuring than okay for trial of labor, but she  could also have an elective c-setion; she prefers to try for a VD which is what we recommend and will talk to her more about this later in pregnancy ?A: 34%, 650gm, AC 47%, mvp wnl ?B: 25%, 624gm, AC 23%, mvp wnl ? ?Preterm labor symptoms and general obstetric precautions including but not limited to vaginal bleeding, contractions, leaking of fluid and fetal movement were reviewed in detail with the patient. ?Please refer to After Visit Summary for other counseling recommendations.  ? ?Return in about 2 weeks (around 06/01/2021) for in person, md or app, high risk ob, fasting 2hr GTT. ? ?Future Appointments  ?Date Time Provider [place]  ?06/15/2021  8:30 AM [facility] NURSE [facility]  ?06/15/2021  8:45 AM [facility] US5 [facility]  ? ? ?Aletha Halim, MD ? ?

## 2021-06-02 ENCOUNTER — Encounter: Payer: Self-pay | Admitting: Urgent Care

## 2021-06-02 ENCOUNTER — Telehealth: Payer: Medicaid Other | Admitting: Urgent Care

## 2021-06-02 DIAGNOSIS — B3731 Acute candidiasis of vulva and vagina: Secondary | ICD-10-CM

## 2021-06-02 MED ORDER — MICONAZOLE NITRATE 2 % VA CREA: TOPICAL_CREAM | VAGINAL | 0 refills | Status: DC

## 2021-06-02 NOTE — Progress Notes (Signed)
?Virtual Visit Consent  ? ?Erin Harrison, you are scheduled for a virtual visit with a [facility] provider today. Just as with appointments in the office, your consent must be obtained to participate. Your consent will be active for this visit and any virtual visit you may have with one of our providers in the next 365 days. If you have a MyChart account, a copy of this consent can be sent to you electronically. ? ?As this is a virtual visit, video technology does not allow for your provider to perform a traditional examination. This may limit your provider's ability to fully assess your condition. If your provider identifies any concerns that need to be evaluated in person or the need to arrange testing (such as labs, EKG, etc.), we will make arrangements to do so. Although advances in technology are sophisticated, we cannot ensure that it will always work on either your end or our end. If the connection with a video visit is poor, the visit may have to be switched to a telephone visit. With either a video or telephone visit, we are not always able to ensure that we have a secure connection. ? ?By engaging in this virtual visit, you consent to the provision of healthcare and authorize for your insurance to be billed (if applicable) for the services provided during this visit. Depending on your insurance coverage, you may receive a charge related to this service. ? ?I need to obtain your verbal consent now. Are you willing to proceed with your visit today? Adalyna Godbee has provided verbal consent on 06/02/2021 for a virtual visit (video or telephone). Deriona Altemose L Darshay Deupree, PA ? ?Date: 06/02/2021 12:25 PM ? ?Virtual Visit via Video Note  ? ?I, Erin Harrison L Erin Harrison, connected with  Erin Harrison  ([id number], [birth year]) on 06/02/21 at 12:15 PM EDT by a video-enabled telemedicine application and verified that I am speaking with the correct person using two identifiers. ? ?Location: ?Patient: Virtual Visit Location  Patient: Home ?Provider: Virtual Visit Location Provider: Home Office ?  ?I discussed the limitations of evaluation and management by telemedicine and the availability of in person appointments. The patient expressed understanding and agreed to proceed.   ? ?History of Present Illness: ?Erin Harrison is a 26 y.o. who identifies as a female who was assigned female at birth, and is being seen today for vaginal irritation. ? ?HPI: Pleasant 26yo female who is at the end of her second trimester of pregnancy presents with a two day history of vaginal irritation. She states that on April 15th, she had similar sx and took one tablet of diflucan with resolution to the irritation. 2 days ago however her symptoms came back. Denies any vaginal discharge, just states it feels "irritated and itchy." The symptoms are both internal and external. Denies rash, open sores, dysuria, hematuria, pelvic pain, pelvic cramping, or any change to fetal movements. Has not reached out to her OB regarding her symptoms. Had a normal A1C in Jan. ? ?  ?Problems:  ?Patient Active Problem List  ? Diagnosis Date Noted  ? Marginal insertion of umbilical cord affecting management of mother 04/17/2021  ? Supervision of high risk pregnancy, antepartum 02/13/2021  ? Twin pregnancy, twins dichorionic and diamniotic 02/13/2021  ?  ?Allergies: No Known Allergies ?Medications:  ?Current Outpatient Medications:  ?  miconazole (MONISTAT 7 SIMPLY CURE) 2 % vaginal cream, Place one applicatorful daily at bedtime x 7 days, Disp: 45 g, Rfl: 0 ?  acetaminophen (TYLENOL) 500 MG tablet, Take 2  tablets (1,000 mg total) by mouth every 6 (six) hours as needed., Disp: 30 tablet, Rfl: 3 ?  cyclobenzaprine (FLEXERIL) 10 MG tablet, Take 1 tablet (10 mg total) by mouth every 8 (eight) hours as needed for muscle spasms. (Patient not taking: Reported on 02/23/2021), Disp: 30 tablet, Rfl: 1 ?  Doxylamine-Pyridoxine 10-10 MG TBEC, Take 2 tablets by mouth at bedtime. (Patient not  taking: Reported on 03/23/2021), Disp: 90 tablet, Rfl: 2 ?  prenatal vitamin w/FE, FA (PRENATAL 1 + 1) 27-1 MG TABS tablet, Take 1 tablet by mouth daily at 12 noon., Disp: , Rfl:  ? ?Observations/Objective: ?Patient is well-developed, well-nourished in no acute distress.  ?Resting comfortably, NAD at home.  ?Head is normocephalic, atraumatic.  ?No labored breathing.  ?Speech is clear and coherent with logical content.  ?Patient is alert and oriented at baseline.  ? ? ?Assessment and Plan: ?1. Candidal vulvovaginitis ? ?Pt is in 2nd trimester of pregnancy. Discussed pregnancy categories with medications, will opt to try OTC/ vaginal creams prior to repeating PO medication. Pt will reach out to OB on Monday to discuss possible further testing given her recurrence of symptoms 2x in the past month. ? ?Follow Up Instructions: ?I discussed the assessment and treatment plan with the patient. The patient was provided an opportunity to ask questions and all were answered. The patient agreed with the plan and demonstrated an understanding of the instructions.  A copy of instructions were sent to the patient via MyChart unless otherwise noted below.  ? ? ? ?The patient was advised to call back or seek an in-person evaluation if the symptoms worsen or if the condition fails to improve as anticipated. ? ?Time:  ?I spent 5 minutes with the patient via telehealth technology discussing the above problems/concerns.   ? ?Jet Armbrust L Genola Yuille, PA  ?

## 2021-06-02 NOTE — Patient Instructions (Signed)
?  Erin Harrison, thank you for joining Maretta Bees, PA for today's virtual visit.  While this provider is not your primary care provider (PCP), if your PCP is located in our provider database this encounter information will be shared with them immediately following your visit. ? ?Consent: ?(Patient) Erin Harrison provided verbal consent for this virtual visit at the beginning of the encounter. ? ?Current Medications: ? ?Current Outpatient Medications:  ?  miconazole (MONISTAT 7 SIMPLY CURE) 2 % vaginal cream, Place one applicatorful daily at bedtime x 7 days, Disp: 45 g, Rfl: 0 ?  acetaminophen (TYLENOL) 500 MG tablet, Take 2 tablets (1,000 mg total) by mouth every 6 (six) hours as needed., Disp: 30 tablet, Rfl: 3 ?  cyclobenzaprine (FLEXERIL) 10 MG tablet, Take 1 tablet (10 mg total) by mouth every 8 (eight) hours as needed for muscle spasms. (Patient not taking: Reported on 02/23/2021), Disp: 30 tablet, Rfl: 1 ?  Doxylamine-Pyridoxine 10-10 MG TBEC, Take 2 tablets by mouth at bedtime. (Patient not taking: Reported on 03/23/2021), Disp: 90 tablet, Rfl: 2 ?  prenatal vitamin w/FE, FA (PRENATAL 1 + 1) 27-1 MG TABS tablet, Take 1 tablet by mouth daily at 12 noon., Disp: , Rfl:   ? ?Medications ordered in this encounter:  ?Meds ordered this encounter  ?Medications  ? miconazole (MONISTAT 7 SIMPLY CURE) 2 % vaginal cream  ?  Sig: Place one applicatorful daily at bedtime x 7 days  ?  Dispense:  45 g  ?  Refill:  0  ?  Order Specific Question:   Supervising Provider  ?  Answer:   Eber Hong [[id number]]  ?  ? ?*If you need refills on other medications prior to your next appointment, please contact your pharmacy* ? ?Follow-Up: ?Call back or seek an in-person evaluation if the symptoms worsen or if the condition fails to improve as anticipated. ? ?Other Instructions ?Please start using miconizole 7, called in for you today, nightly. This is safe with pregnancy. Please call your OB on Monday for a further workup since  this is a recurrent issue. If any pelvic symptoms or bleeding, head to the ER. ? ? ?If you have been instructed to have an in-person evaluation today at a local Urgent Care facility, please use the link below. It will take you to a list of all of our available [facility], including address, phone number and hours of operation. Please do not delay care.  ?[facility] ? ?If you or a family member do not have a primary care provider, use the link below to schedule a visit and establish care. When you choose a [facility] primary care physician or advanced practice provider, you gain a long-term partner in health. ?Find a Primary Care Provider ? ?Learn more about [facility]'s in-office and virtual care options: ?[facility] - Get Care Now  ?

## 2021-06-04 ENCOUNTER — Other Ambulatory Visit: Payer: Self-pay | Admitting: *Deleted

## 2021-06-04 DIAGNOSIS — O0990 Supervision of high risk pregnancy, unspecified, unspecified trimester: Secondary | ICD-10-CM

## 2021-06-08 ENCOUNTER — Other Ambulatory Visit (HOSPITAL_COMMUNITY)
Admission: RE | Admit: 2021-06-08 | Discharge: 2021-06-08 | Disposition: A | Discharge status: Home / Self Care | Payer: Medicaid Other | Source: Ambulatory Visit | Attending: Obstetrics and Gynecology | Admitting: Obstetrics and Gynecology

## 2021-06-08 ENCOUNTER — Other Ambulatory Visit: Payer: Medicaid Other

## 2021-06-08 ENCOUNTER — Ambulatory Visit (INDEPENDENT_AMBULATORY_CARE_PROVIDER_SITE_OTHER): Payer: Medicaid Other | Admitting: Obstetrics and Gynecology

## 2021-06-08 VITALS — BP 117/72 | HR 85 | Wt 172.4 lb

## 2021-06-08 DIAGNOSIS — Z23 Encounter for immunization: Secondary | ICD-10-CM | POA: Diagnosis not present

## 2021-06-08 DIAGNOSIS — O26893 Other specified pregnancy related conditions, third trimester: Secondary | ICD-10-CM | POA: Diagnosis present

## 2021-06-08 DIAGNOSIS — N898 Other specified noninflammatory disorders of vagina: Secondary | ICD-10-CM | POA: Diagnosis present

## 2021-06-08 DIAGNOSIS — Z3A27 27 weeks gestation of pregnancy: Secondary | ICD-10-CM

## 2021-06-08 DIAGNOSIS — O30042 Twin pregnancy, dichorionic/diamniotic, second trimester: Secondary | ICD-10-CM

## 2021-06-08 DIAGNOSIS — O0990 Supervision of high risk pregnancy, unspecified, unspecified trimester: Secondary | ICD-10-CM

## 2021-06-08 DIAGNOSIS — O43199 Other malformation of placenta, unspecified trimester: Secondary | ICD-10-CM

## 2021-06-08 NOTE — Progress Notes (Signed)
? ?  PRENATAL VISIT NOTE ? ?Subjective:  ?Erin Harrison is a 26 y.o. G3P1011 at [redacted]w[redacted]d being seen today for ongoing prenatal care.  She is currently monitored for the following issues for this high-risk pregnancy and has Supervision of high risk pregnancy, antepartum; Twin pregnancy, twins dichorionic and diamniotic; and Marginal insertion of umbilical cord affecting management of mother on their problem list. ? ?Patient doing well with no acute concerns today. She reports no complaints.  Contractions: Not present. Vag. Bleeding: None.  Movement: Present. Denies leaking of fluid.  ? ?The following portions of the patient's history were reviewed and updated as appropriate: allergies, current medications, past family history, past medical history, past social history, past surgical history and problem list. Problem list updated. ? ?Objective:  ? ?Vitals:  ? 06/08/21 0841  ?BP: 117/72  ?Pulse: 85  ?Weight: 78.2 kg  ? ? ?Fetal Status: Fetal Heart Rate (bpm): 154/149 Fundal Height: 30 cm Movement: Present    ? ?General:  Alert, oriented and cooperative. Patient is in no acute distress.  ?Skin: Skin is warm and dry. No rash noted.   ?Cardiovascular: Normal heart rate noted  ?Respiratory: Normal respiratory effort, no problems with respiration noted  ?Abdomen: Soft, gravid, appropriate for gestational age.  Pain/Pressure: Absent     ?Pelvic: Cervical exam deferred        ?Extremities: Normal range of motion.  Edema: None  ?Mental Status:  Normal mood and affect. Normal behavior. Normal judgment and thought content.  ? ?Assessment and Plan:  ?Pregnancy: G3P1011 at [redacted]w[redacted]d ? ?1. Supervision of high risk pregnancy, antepartum ?Continue routine care ?- Tdap vaccine greater than or equal to 7yo IM ? ?2. Vaginal discharge during pregnancy in third trimester ?Self swab taken ?- Cervicovaginal ancillary only( [facility]) ? ?3. [redacted] weeks gestation of pregnancy ? ? ?4. Dichorionic diamniotic twin pregnancy in second trimester ?Pt has  repeat scan on 5/19 ? ?5. Marginal insertion of umbilical cord affecting management of mother ? ? ?Preterm labor symptoms and general obstetric precautions including but not limited to vaginal bleeding, contractions, leaking of fluid and fetal movement were reviewed in detail with the patient. ? ?Please refer to After Visit Summary for other counseling recommendations.  ? ?Return in about 2 weeks (around 06/22/2021) for [facility], in person. ? ? ?Lynnda Shields, MD ?Faculty Attending ?[facility] ?  ?

## 2021-06-08 NOTE — Progress Notes (Signed)
Pt states that she's concerned that she has had 2 yeast infections in 1 month, 1st was given diflucan & then the Vaginal cream, PT states did not finish the cream due to increased irritation. Pt wanted to be tested to make sure nothing else is wrong. Pt is doing self swab today. ?

## 2021-06-09 LAB — CBC
Hematocrit: 33 % — ABNORMAL LOW (ref 34.0–46.6)
Hemoglobin: 11.1 g/dL (ref 11.1–15.9)
MCH: 31 pg (ref 26.6–33.0)
MCHC: 33.6 g/dL (ref 31.5–35.7)
MCV: 92 fL (ref 79–97)
Platelets: 172 10*3/uL (ref 150–450)
RBC: 3.58 x10E6/uL — ABNORMAL LOW (ref 3.77–5.28)
RDW: 11.6 % — ABNORMAL LOW (ref 11.7–15.4)
WBC: 5.9 10*3/uL (ref 3.4–10.8)

## 2021-06-09 LAB — GLUCOSE TOLERANCE, 2 HOURS W/ 1HR
Glucose, 1 hour: 123 mg/dL (ref 70–179)
Glucose, 2 hour: 106 mg/dL (ref 70–152)
Glucose, Fasting: 78 mg/dL (ref 70–91)

## 2021-06-09 LAB — HIV ANTIBODY (ROUTINE TESTING W REFLEX): HIV Screen 4th Generation wRfx: NONREACTIVE

## 2021-06-09 LAB — RPR: RPR Ser Ql: NONREACTIVE

## 2021-06-11 LAB — CERVICOVAGINAL ANCILLARY ONLY
Bacterial Vaginitis (gardnerella): NEGATIVE
Candida Glabrata: NEGATIVE
Candida Vaginitis: NEGATIVE
Chlamydia: NEGATIVE
Comment: NEGATIVE
Comment: NEGATIVE
Comment: NEGATIVE
Comment: NEGATIVE
Comment: NEGATIVE
Comment: NORMAL
Neisseria Gonorrhea: NEGATIVE
Trichomonas: NEGATIVE

## 2021-06-14 ENCOUNTER — Telehealth: Payer: Self-pay

## 2021-06-15 ENCOUNTER — Ambulatory Visit: Payer: Medicaid Other

## 2021-06-22 ENCOUNTER — Encounter: Payer: Medicaid Other | Admitting: Certified Nurse Midwife

## 2021-06-26 ENCOUNTER — Other Ambulatory Visit: Payer: Self-pay

## 2021-06-26 ENCOUNTER — Inpatient Hospital Stay (HOSPITAL_COMMUNITY)
Admission: AD | Admit: 2021-06-26 | Discharge: 2021-06-26 | Disposition: A | Discharge status: Home / Self Care | Payer: Medicaid Other | Attending: Obstetrics & Gynecology | Admitting: Obstetrics & Gynecology

## 2021-06-26 ENCOUNTER — Encounter (HOSPITAL_COMMUNITY): Payer: Self-pay | Admitting: Obstetrics & Gynecology

## 2021-06-26 DIAGNOSIS — O30043 Twin pregnancy, dichorionic/diamniotic, third trimester: Secondary | ICD-10-CM | POA: Diagnosis not present

## 2021-06-26 DIAGNOSIS — L299 Pruritus, unspecified: Secondary | ICD-10-CM | POA: Diagnosis not present

## 2021-06-26 DIAGNOSIS — B3731 Acute candidiasis of vulva and vagina: Secondary | ICD-10-CM | POA: Diagnosis not present

## 2021-06-26 DIAGNOSIS — O0990 Supervision of high risk pregnancy, unspecified, unspecified trimester: Secondary | ICD-10-CM

## 2021-06-26 DIAGNOSIS — O26893 Other specified pregnancy related conditions, third trimester: Secondary | ICD-10-CM

## 2021-06-26 DIAGNOSIS — Z3A29 29 weeks gestation of pregnancy: Secondary | ICD-10-CM | POA: Diagnosis not present

## 2021-06-26 DIAGNOSIS — O30042 Twin pregnancy, dichorionic/diamniotic, second trimester: Secondary | ICD-10-CM

## 2021-06-26 DIAGNOSIS — O23593 Infection of other part of genital tract in pregnancy, third trimester: Secondary | ICD-10-CM | POA: Insufficient documentation

## 2021-06-26 DIAGNOSIS — R102 Pelvic and perineal pain: Secondary | ICD-10-CM | POA: Insufficient documentation

## 2021-06-26 LAB — WET PREP, GENITAL
Clue Cells Wet Prep HPF POC: NONE SEEN
Sperm: NONE SEEN
Trich, Wet Prep: NONE SEEN
WBC, Wet Prep HPF POC: <10 (ref <10)
Yeast Wet Prep HPF POC: NONE SEEN

## 2021-06-26 LAB — URINALYSIS, ROUTINE W REFLEX MICROSCOPIC
Bilirubin Urine: NEGATIVE
Glucose, UA: NEGATIVE mg/dL
Hgb urine dipstick: NEGATIVE
Ketones, ur: 20 mg/dL — AB
Leukocytes,Ua: NEGATIVE
Nitrite: NEGATIVE
Protein, ur: NEGATIVE mg/dL
Specific Gravity, Urine: 1.009 (ref 1.005–1.030)
pH: 7 (ref 5.0–8.0)

## 2021-06-26 LAB — AMNISURE RUPTURE OF MEMBRANE (ROM) NOT AT ARMC: Amnisure ROM: NEGATIVE

## 2021-06-26 MED ORDER — TERCONAZOLE 0.4 % VA CREA: 1.0000 | TOPICAL_CREAM | Freq: Every day | VAGINAL | 0 refills | Status: DC

## 2021-06-26 NOTE — MAU Note (Signed)
Erin Harrison is a 26 y.o. at [redacted]w[redacted]d here in MAU reporting: she had LOF yesterday x2, but none since.  Also states now feeling increased pelvic pressure which is worse with standing or ambulating.  Denies VB.  Endorses FM.  Also states was exposed to hand/foot/mouth disease, daughter diagnosed yesterday.  Onset of complaint: yesterday Pain score: 8/10 pelvic pressure Vitals:   06/26/21 1319  BP: 101/66  Pulse: 85  Resp: 18  Temp: 97.8 F (36.6 C)  SpO2: 99%     FHT:=FM Lab orders placed from triage:

## 2021-06-26 NOTE — MAU Note (Signed)
Called Main Lab to request Amnisure.

## 2021-06-26 NOTE — MAU Provider Note (Signed)
History     CSN: [id number]  Arrival date and time: 06/26/21 1240   Event Date/Time   First Provider Initiated Contact with Patient 06/26/21 1339      Chief Complaint  Patient presents with   Rupture of Membranes   Cramping   Pelvic Pressure   HPI  Ms. Erin Harrison is a 26 y.o. year old G23P1011 female at [redacted]w[redacted]d weeks gestation who presents to MAU reporting leaking of fluid 2 times yesterday, but none since. She also complains of increased pelvic pressure that worsens with standing and walking; rated 8/10. She reports her daughter was diagnosed with hand, foot and mouth disease yesterday. The patient now reports "having symptoms of it, too." She denies VB. She reports (+) FM x 2. Her pregnancy is complicated by Di-Di twins. She receives [facility] with [facility]; next appt is 07/06/2021.   OB History     Gravida  3   Para  1   Term  1   Preterm      AB  1   Living  1      SAB      IAB  1   Ectopic      Multiple  0   Live Births  1           Past Medical History:  Diagnosis Date   Chlamydia    Headache    Hypoglycemia    Infection    UTI    Past Surgical History:  Procedure Laterality Date   DILATION AND CURETTAGE OF UTERUS     WISDOM TOOTH EXTRACTION      Family History  Problem Relation Age of Onset   Thyroid disease Mother    Diabetes Father    Stroke Father    Cancer Maternal Grandmother        breast   Diabetes Paternal Grandfather    Hypertension Paternal Grandfather     Social History   Tobacco Use   Smoking status: Never   Smokeless tobacco: Never  Vaping Use   Vaping Use: Never used  Substance Use Topics   Alcohol use: Not Currently    Comment: occ   Drug use: No    Allergies: No Known Allergies  Medications Prior to Admission  Medication Sig Dispense Refill Last Dose   prenatal vitamin w/FE, FA (PRENATAL 1 + 1) 27-1 MG TABS tablet Take 1 tablet by mouth daily at 12 noon.   06/26/2021   acetaminophen (TYLENOL) 500 MG tablet  Take 2 tablets (1,000 mg total) by mouth every 6 (six) hours as needed. 30 tablet 3    cyclobenzaprine (FLEXERIL) 10 MG tablet Take 1 tablet (10 mg total) by mouth every 8 (eight) hours as needed for muscle spasms. (Patient not taking: Reported on 02/23/2021) 30 tablet 1    Doxylamine-Pyridoxine 10-10 MG TBEC Take 2 tablets by mouth at bedtime. (Patient not taking: Reported on 03/23/2021) 90 tablet 2    miconazole (MONISTAT 7 SIMPLY CURE) 2 % vaginal cream Place one applicatorful daily at bedtime x 7 days (Patient not taking: Reported on 06/08/2021) 45 g 0     Review of Systems  Constitutional: Negative.   HENT: Negative.         Hands itching  Eyes: Negative.   Respiratory: Negative.    Cardiovascular: Negative.   Gastrointestinal: Negative.   Endocrine: Negative.   Genitourinary:  Positive for pelvic pain (increase pressure).  Musculoskeletal: Negative.   Skin: Negative.   Allergic/Immunologic: Negative.   Neurological:  Negative.   Hematological: Negative.   Psychiatric/Behavioral: Negative.    Physical Exam   Blood pressure 101/66, pulse 85, temperature 97.8 F (36.6 C), temperature source Oral, resp. rate 18, height 5\' 3"  (1.6 m), weight 78.8 kg, SpO2 99 %, unknown if currently breastfeeding.  Physical Exam Vitals and nursing note reviewed. Exam conducted with a chaperone present.  Constitutional:      Appearance: Normal appearance. She is normal weight.  Cardiovascular:     Rate and Rhythm: Normal rate.  Pulmonary:     Effort: Pulmonary effort is normal.  Abdominal:     Palpations: Abdomen is soft.     Tenderness: There is no abdominal tenderness.  Genitourinary:    General: Normal vulva.     Comments: Pelvic exam: External genitalia normal, SE: vaginal walls pink and well rugated, cervix is smooth, pink, no lesions, large amt of thick, cottage cheese-like, white vaginal d/c adherent to vaginal walls and collected at introitus-- Amnisure, WP and GC/CT done, cervix visually  closed, Uterus is non-tender, S=D, no CMT or friability, no adnexal tenderness.  Musculoskeletal:        General: Normal range of motion.  Skin:    General: Skin is warm and dry.  Neurological:     Mental Status: She is alert and oriented to person, place, and time.  Psychiatric:        Mood and Affect: Mood normal.        Behavior: Behavior normal.        Thought Content: Thought content normal.        Judgment: Judgment normal.   REACTIVE NST - FHR (A): 145 bpm / moderate variability / accels present / decels absent / FHR (B): 125 bpm / moderate variability / accels present / decels absent / TOCO: irregular UC's with UI noted   MAU Course  Procedures  MDM Wet Prep GC/CT -- Results pending  Amnisure  Results for orders placed or performed during the hospital encounter of 06/26/21 (from the past 24 hour(s))  Urinalysis, Routine w reflex microscopic Urine, Clean Catch     Status: Abnormal   Collection Time: 06/26/21  1:29 PM  Result Value Ref Range   Color, Urine YELLOW YELLOW   APPearance CLEAR CLEAR   Specific Gravity, Urine 1.009 1.005 - 1.030   pH 7.0 5.0 - 8.0   Glucose, UA NEGATIVE NEGATIVE mg/dL   Hgb urine dipstick NEGATIVE NEGATIVE   Bilirubin Urine NEGATIVE NEGATIVE   Ketones, ur 20 (A) NEGATIVE mg/dL   Protein, ur NEGATIVE NEGATIVE mg/dL   Nitrite NEGATIVE NEGATIVE   Leukocytes,Ua NEGATIVE NEGATIVE  Wet prep, genital     Status: None   Collection Time: 06/26/21  1:50 PM   Specimen: Vaginal  Result Value Ref Range   Yeast Wet Prep HPF POC NONE SEEN NONE SEEN   Trich, Wet Prep NONE SEEN NONE SEEN   Clue Cells Wet Prep HPF POC NONE SEEN NONE SEEN   WBC, Wet Prep HPF POC <10 <10   Sperm NONE SEEN   Amnisure rupture of membrane (rom)not at [facility]     Status: None   Collection Time: 06/26/21  1:50 PM  Result Value Ref Range   Amnisure ROM NEGATIVE     Assessment and Plan  Pelvic pain affecting pregnancy in third trimester, antepartum  - Reassurance given no  cervical dilation. Pain could possibly be from normal discomforts of pregnancy  Candida vaginitis  - Rx for Terazol 0.4% cream vaginally hs x 7  days - Information provided on candida vaginitis   [redacted] weeks gestation of pregnancy - Can take Benadryl 25 mg as directed on packaging for itching of hands - Information provided on safe meds in pregnancy  - Discharge patient - Keep scheduled appt with [facility] on 07/06/21 - Patient verbalized an understanding of the plan of care and agrees.    Laury Deep, CNM 06/26/2021, 2:01 PM

## 2021-06-26 NOTE — Discharge Instructions (Signed)
Take Benadryl as directed on the packaging for itching.  Safe Medications in Pregnancy   Acne: Benzoyl Peroxide Salicylic Acid  Backache/Headache: Tylenol: 2 regular strength every 4 hours OR              2 Extra strength every 6 hours  Colds/Coughs/Allergies: Benadryl (alcohol free) 25 mg every 6 hours as needed Breath right strips Claritin Cepacol throat lozenges Chloraseptic throat spray Cold-Eeze- up to three times per day Cough drops, alcohol free Flonase (by prescription only) Guaifenesin Mucinex Robitussin DM (plain only, alcohol free) Saline nasal spray/drops Sudafed (pseudoephedrine) & Actifed ** use only after [redacted] weeks gestation and if you do not have high blood pressure Tylenol Vicks Vaporub Zinc lozenges Zyrtec   Constipation: Colace Ducolax suppositories Fleet enema Glycerin suppositories Metamucil Milk of magnesia Miralax Senokot Smooth move tea  Diarrhea: Kaopectate Imodium A-D  *NO pepto Bismol  Hemorrhoids: Anusol Anusol HC Preparation H Tucks  Indigestion: Tums Maalox Mylanta Zantac  Pepcid  Insomnia: Benadryl (alcohol free) 25mg  every 6 hours as needed Tylenol PM Unisom, no Gelcaps  Leg Cramps: Tums MagGel  Nausea/Vomiting:  Bonine Dramamine Emetrol Ginger extract Sea bands Meclizine  Nausea medication to take during pregnancy:  Unisom (doxylamine succinate 25 mg tablets) Take one tablet daily at bedtime. If symptoms are not adequately controlled, the dose can be increased to a maximum recommended dose of two tablets daily (1/2 tablet in the morning, 1/2 tablet mid-afternoon and one at bedtime). Vitamin B6 100mg  tablets. Take one tablet twice a day (up to 200 mg per day).  Skin Rashes: Aveeno products Benadryl cream or 25mg  every 6 hours as needed Calamine Lotion 1% cortisone cream  Yeast infection: Gyne-lotrimin 7 Monistat 7   **If taking multiple medications, please check labels to avoid duplicating the  same active ingredients **take medication as directed on the label ** Do not exceed 4000 mg of tylenol in 24 hours **Do not take medications that contain aspirin or ibuprofen

## 2021-06-27 ENCOUNTER — Ambulatory Visit: Payer: Medicaid Other

## 2021-06-27 LAB — GC/CHLAMYDIA PROBE AMP (~~LOC~~) NOT AT ARMC
Chlamydia: NEGATIVE
Comment: NEGATIVE
Comment: NORMAL
Neisseria Gonorrhea: NEGATIVE

## 2021-06-29 ENCOUNTER — Other Ambulatory Visit: Payer: Self-pay

## 2021-07-06 ENCOUNTER — Ambulatory Visit: Payer: Medicaid Other | Attending: Obstetrics and Gynecology | Admitting: Obstetrics and Gynecology

## 2021-07-06 ENCOUNTER — Other Ambulatory Visit: Payer: Self-pay | Admitting: *Deleted

## 2021-07-06 ENCOUNTER — Encounter: Payer: Self-pay | Admitting: *Deleted

## 2021-07-06 ENCOUNTER — Ambulatory Visit (INDEPENDENT_AMBULATORY_CARE_PROVIDER_SITE_OTHER): Payer: Medicaid Other | Admitting: Obstetrics & Gynecology

## 2021-07-06 ENCOUNTER — Ambulatory Visit: Payer: Medicaid Other | Admitting: *Deleted

## 2021-07-06 ENCOUNTER — Other Ambulatory Visit: Payer: Self-pay | Admitting: Maternal & Fetal Medicine

## 2021-07-06 ENCOUNTER — Ambulatory Visit: Payer: Medicaid Other | Attending: Maternal & Fetal Medicine

## 2021-07-06 VITALS — BP 127/68 | HR 79

## 2021-07-06 VITALS — BP 115/82 | HR 76 | Wt 175.0 lb

## 2021-07-06 DIAGNOSIS — O30043 Twin pregnancy, dichorionic/diamniotic, third trimester: Secondary | ICD-10-CM

## 2021-07-06 DIAGNOSIS — Z3A31 31 weeks gestation of pregnancy: Secondary | ICD-10-CM

## 2021-07-06 DIAGNOSIS — O365931 Maternal care for other known or suspected poor fetal growth, third trimester, fetus 1: Secondary | ICD-10-CM | POA: Diagnosis not present

## 2021-07-06 DIAGNOSIS — O0990 Supervision of high risk pregnancy, unspecified, unspecified trimester: Secondary | ICD-10-CM | POA: Diagnosis present

## 2021-07-06 DIAGNOSIS — O43193 Other malformation of placenta, third trimester: Secondary | ICD-10-CM

## 2021-07-06 DIAGNOSIS — O43192 Other malformation of placenta, second trimester: Secondary | ICD-10-CM | POA: Diagnosis not present

## 2021-07-06 DIAGNOSIS — O283 Abnormal ultrasonic finding on antenatal screening of mother: Secondary | ICD-10-CM

## 2021-07-06 DIAGNOSIS — O43199 Other malformation of placenta, unspecified trimester: Secondary | ICD-10-CM

## 2021-07-06 DIAGNOSIS — O30042 Twin pregnancy, dichorionic/diamniotic, second trimester: Secondary | ICD-10-CM

## 2021-07-06 NOTE — Progress Notes (Signed)
   PRENATAL VISIT NOTE  Subjective:  Erin Harrison is a 26 y.o. G3P1011 at [redacted]w[redacted]d being seen today for ongoing prenatal care.  She is currently monitored for the following issues for this high-risk pregnancy and has Supervision of high risk pregnancy, antepartum; Twin pregnancy, twins dichorionic and diamniotic; and Marginal insertion of umbilical cord affecting management of mother on their problem list.  Patient reports occasional contractions.  Contractions: Irritability. Vag. Bleeding: None.  Movement: Present. Denies leaking of fluid.   The following portions of the patient's history were reviewed and updated as appropriate: allergies, current medications, past family history, past medical history, past social history, past surgical history and problem list.   Objective:   Vitals:   07/06/21 0840  BP: 115/82  Pulse: 76  Weight: 175 lb (79.4 kg)    Fetal Status: Fetal Heart Rate (bpm): 154/128   Movement: Present     General:  Alert, oriented and cooperative. Patient is in no acute distress.  Skin: Skin is warm and dry. No rash noted.   Cardiovascular: Normal heart rate noted  Respiratory: Normal respiratory effort, no problems with respiration noted  Abdomen: Soft, gravid, appropriate for gestational age.  Pain/Pressure: Absent     Pelvic: Cervical exam deferred        Extremities: Normal range of motion.  Edema: None  Mental Status: Normal mood and affect. Normal behavior. Normal judgment and thought content.   Assessment and Plan:  Pregnancy: G3P1011 at [redacted]w[redacted]d 1. Dichorionic diamniotic twin pregnancy in third trimester F/u US today  2. Marginal insertion of umbilical cord affecting management of mother Follow growth  3. Supervision of high risk pregnancy, antepartum Doing well  Preterm labor symptoms and general obstetric precautions including but not limited to vaginal bleeding, contractions, leaking of fluid and fetal movement were reviewed in detail with the  patient. Please refer to After Visit Summary for other counseling recommendations.   Return in about 2 weeks (around 07/20/2021).  Future Appointments  Date Time Provider [place]  07/06/2021  9:30 AM [facility] NURSE [facility]  07/06/2021  9:45 AM [facility] US6 [facility]  07/20/2021  8:15 AM Woodroe Mode, MD [facility]  08/03/2021  8:15 AM Griffin Basil, MD [facility]  08/10/2021  8:15 AM Griffin Basil, MD [facility]  08/17/2021  8:15 AM Woodroe Mode, MD [facility]  08/24/2021  8:15 AM Griffin Basil, MD [facility]  08/31/2021 10:35 AM Aletha Halim, MD [facility]  09/07/2021 10:35 AM Aletha Halim, MD [facility]    Emeterio Reeve, MD

## 2021-07-06 NOTE — Progress Notes (Signed)
Maternal-Fetal Medicine   Name: Erin Harrison DOB: 12/10/1995 MRN: [id number] Referring Provider: James Arnold, MD  I had the pleasure of seeing [name] today at the [facility]. She is G3 P1011 at 31w 1d gestation with twin pregnancy and is here for fetal growth assessment. She has dichorionic-diamniotic twin pregnancy. On cell-free fetal DNA screening, the risks of fetal aneuploidies are not increased.  Patient does not have gestational diabetes. Obstetric history is significant for a term vaginal delivery in 2019 of a female infant weighing 6 pounds and 8 ounces at birth. Patient does not have any chronic medical conditions.  Ultrasound Twin A: Lower fetus, breech presentation, anterior placenta, female fetus.  Marginal cord insertion is seen again.  Amniotic fluid is normal and good fetal activity seen.  The estimated fetal weight is at the 9th percentile and the abdominal circumference measurement is at the 14th percentile.  Head circumference measurement is at between -2 and -1 SD (normal).  Umbilical artery Doppler showed normal forward diastolic flow.  Twin B: Upper fetus, transverse lie and head to maternal right, anterior placenta, female fetus.  Fetal growth is appropriate for gestational age.  Amniotic fluid is normal and good fetal activity seen.  Umbilical artery Doppler showed normal forward diastolic flow.  Growth discordancy: 15%. Fetal growth restriction (twin A) I explained the finding of fetal growth restriction that complicates twin pregnancies more often than singleton pregnancies. I discussed the possible causes of fetal growth restriction including placental insufficiency (most common cause), chromosomal anomalies and fetal infections.  In late onset fetal growth restriction, chromosomal anomalies are less likely.  Patient did not give history of fever or rashes.  Twin A is marginal cord insertion.  I discussed ultrasound protocol of monitoring  fetal growth restriction.  Timing of delivery: Since small for gestational age fetuses have a higher chance of having perinatal mortality and morbidity, delivery at [redacted] weeks gestation is reasonable.  If interval growth is suboptimal and/or severe fetal growth restriction is seen, we would recommend delivery at [redacted] weeks gestation. Mode of delivery will be based on fetal presentations.  Recommendations -Weekly BPP and UA Doppler till delivery. -Fetal growth assessment in 3 weeks.  Thank you for consultation.  If you have any questions or concerns, please contact me the [facility].  Consultation including face-to-face (more than 50%) counseling 30 minutes.  

## 2021-07-10 ENCOUNTER — Other Ambulatory Visit: Payer: Self-pay | Admitting: *Deleted

## 2021-07-10 DIAGNOSIS — O30043 Twin pregnancy, dichorionic/diamniotic, third trimester: Secondary | ICD-10-CM

## 2021-07-10 DIAGNOSIS — O283 Abnormal ultrasonic finding on antenatal screening of mother: Secondary | ICD-10-CM

## 2021-07-10 DIAGNOSIS — O43193 Other malformation of placenta, third trimester: Secondary | ICD-10-CM

## 2021-07-13 ENCOUNTER — Ambulatory Visit: Payer: Medicaid Other | Attending: Obstetrics and Gynecology

## 2021-07-13 ENCOUNTER — Ambulatory Visit: Payer: Medicaid Other | Admitting: *Deleted

## 2021-07-13 ENCOUNTER — Encounter: Payer: Self-pay | Admitting: *Deleted

## 2021-07-13 VITALS — BP 113/63 | HR 74

## 2021-07-13 DIAGNOSIS — O358XX1 Maternal care for other (suspected) fetal abnormality and damage, fetus 1: Secondary | ICD-10-CM

## 2021-07-13 DIAGNOSIS — O0990 Supervision of high risk pregnancy, unspecified, unspecified trimester: Secondary | ICD-10-CM

## 2021-07-13 DIAGNOSIS — O43193 Other malformation of placenta, third trimester: Secondary | ICD-10-CM

## 2021-07-13 DIAGNOSIS — O365931 Maternal care for other known or suspected poor fetal growth, third trimester, fetus 1: Secondary | ICD-10-CM

## 2021-07-13 DIAGNOSIS — O283 Abnormal ultrasonic finding on antenatal screening of mother: Secondary | ICD-10-CM | POA: Diagnosis present

## 2021-07-13 DIAGNOSIS — O30043 Twin pregnancy, dichorionic/diamniotic, third trimester: Secondary | ICD-10-CM | POA: Insufficient documentation

## 2021-07-13 DIAGNOSIS — Z3A32 32 weeks gestation of pregnancy: Secondary | ICD-10-CM

## 2021-07-13 NOTE — Procedures (Signed)
Erin Harrison 14-Aug-1995 [redacted]w[redacted]d  Fetus A Non-Stress Test Interpretation for 07/13/21  Indication: Unsatisfactory BPP  Fetal Heart Rate A Mode: External Baseline Rate (A): 140 bpm Variability: Moderate Accelerations: 15 x 15 Decelerations: None Multiple birth?: Yes  Uterine Activity Mode: Palpation, Toco Contraction Frequency (min): UI Contraction Quality: Mild Resting Tone Palpated: Relaxed Resting Time: Adequate  Interpretation (Fetal Testing) Nonstress Test Interpretation: Reactive Comments: Dr. Parke Poisson reviewed tracing.  Erin Harrison 1995-04-30 [redacted]w[redacted]d   Fetus B Non-Stress Test Interpretation for 07/13/21  Indication: Unsatisfactory BPP  Fetal Heart Rate Fetus B Mode: External Baseline Rate (B): 130 BPM Variability: Moderate Accelerations: 15 x 15 Decelerations: None  Uterine Activity Mode: Palpation, Toco Contraction Frequency (min): UI Contraction Quality: Mild Resting Tone Palpated: Relaxed Resting Time: Adequate

## 2021-07-16 ENCOUNTER — Other Ambulatory Visit: Payer: Self-pay | Admitting: Obstetrics and Gynecology

## 2021-07-16 DIAGNOSIS — O283 Abnormal ultrasonic finding on antenatal screening of mother: Secondary | ICD-10-CM

## 2021-07-16 DIAGNOSIS — O43193 Other malformation of placenta, third trimester: Secondary | ICD-10-CM

## 2021-07-16 DIAGNOSIS — O30043 Twin pregnancy, dichorionic/diamniotic, third trimester: Secondary | ICD-10-CM

## 2021-07-17 ENCOUNTER — Ambulatory Visit: Payer: Medicaid Other | Admitting: *Deleted

## 2021-07-17 ENCOUNTER — Ambulatory Visit: Payer: Medicaid Other | Attending: Obstetrics and Gynecology

## 2021-07-17 ENCOUNTER — Encounter: Payer: Self-pay | Admitting: *Deleted

## 2021-07-17 VITALS — BP 114/71 | HR 81

## 2021-07-17 DIAGNOSIS — Z3A32 32 weeks gestation of pregnancy: Secondary | ICD-10-CM | POA: Diagnosis not present

## 2021-07-17 DIAGNOSIS — O43193 Other malformation of placenta, third trimester: Secondary | ICD-10-CM | POA: Insufficient documentation

## 2021-07-17 DIAGNOSIS — O30043 Twin pregnancy, dichorionic/diamniotic, third trimester: Secondary | ICD-10-CM

## 2021-07-17 DIAGNOSIS — O283 Abnormal ultrasonic finding on antenatal screening of mother: Secondary | ICD-10-CM | POA: Diagnosis present

## 2021-07-17 DIAGNOSIS — O365931 Maternal care for other known or suspected poor fetal growth, third trimester, fetus 1: Secondary | ICD-10-CM | POA: Diagnosis not present

## 2021-07-17 DIAGNOSIS — O0990 Supervision of high risk pregnancy, unspecified, unspecified trimester: Secondary | ICD-10-CM

## 2021-07-20 ENCOUNTER — Ambulatory Visit (INDEPENDENT_AMBULATORY_CARE_PROVIDER_SITE_OTHER): Payer: Medicaid Other | Admitting: Obstetrics & Gynecology

## 2021-07-20 ENCOUNTER — Other Ambulatory Visit: Payer: Self-pay

## 2021-07-20 VITALS — BP 111/72 | HR 82 | Wt 178.2 lb

## 2021-07-20 DIAGNOSIS — O30043 Twin pregnancy, dichorionic/diamniotic, third trimester: Secondary | ICD-10-CM

## 2021-07-20 DIAGNOSIS — O0990 Supervision of high risk pregnancy, unspecified, unspecified trimester: Secondary | ICD-10-CM

## 2021-07-26 ENCOUNTER — Other Ambulatory Visit: Payer: Self-pay | Admitting: Obstetrics and Gynecology

## 2021-07-26 ENCOUNTER — Ambulatory Visit: Payer: Medicaid Other | Attending: Obstetrics and Gynecology

## 2021-07-26 ENCOUNTER — Other Ambulatory Visit: Payer: Self-pay | Admitting: *Deleted

## 2021-07-26 ENCOUNTER — Ambulatory Visit (HOSPITAL_BASED_OUTPATIENT_CLINIC_OR_DEPARTMENT_OTHER): Payer: Medicaid Other | Admitting: *Deleted

## 2021-07-26 ENCOUNTER — Ambulatory Visit: Payer: Medicaid Other | Admitting: *Deleted

## 2021-07-26 VITALS — BP 106/67 | HR 84

## 2021-07-26 DIAGNOSIS — O283 Abnormal ultrasonic finding on antenatal screening of mother: Secondary | ICD-10-CM

## 2021-07-26 DIAGNOSIS — O0990 Supervision of high risk pregnancy, unspecified, unspecified trimester: Secondary | ICD-10-CM | POA: Diagnosis present

## 2021-07-26 DIAGNOSIS — Z3A34 34 weeks gestation of pregnancy: Secondary | ICD-10-CM | POA: Diagnosis not present

## 2021-07-26 DIAGNOSIS — O365930 Maternal care for other known or suspected poor fetal growth, third trimester, not applicable or unspecified: Secondary | ICD-10-CM

## 2021-07-26 DIAGNOSIS — O365931 Maternal care for other known or suspected poor fetal growth, third trimester, fetus 1: Secondary | ICD-10-CM

## 2021-07-26 DIAGNOSIS — O43193 Other malformation of placenta, third trimester: Secondary | ICD-10-CM

## 2021-07-26 DIAGNOSIS — O30043 Twin pregnancy, dichorionic/diamniotic, third trimester: Secondary | ICD-10-CM

## 2021-07-26 DIAGNOSIS — O35BXX1 Maternal care for other (suspected) fetal abnormality and damage, fetal cardiac anomalies, fetus 1: Secondary | ICD-10-CM | POA: Diagnosis not present

## 2021-07-26 NOTE — Procedures (Signed)
Erin Harrison 1995-09-05 [redacted]w[redacted]d   Fetus B Non-Stress Test Interpretation for 07/26/21  Indication:  di di twins  Fetal Heart Rate Fetus B Mode: External Baseline Rate (B): 120 BPM Variability: Moderate Accelerations: 15 x 15 Decelerations: None  Uterine Activity Mode: Palpation, Toco Contraction Frequency (min): ui Resting Tone Palpated: Relaxed  Interpretation (Baby B - Fetal Testing) Nonstress Test Interpretation (Baby B): Reactive Overall Impression (Baby B): Reassuring for gestational age Comments (Baby B): Dr. Grace Bushy reviewed tracing  Erin Harrison 02-03-1995 [redacted]w[redacted]d  Fetus A Non-Stress Test Interpretation for 07/26/21  Indication: IUGR  Fetal Heart Rate A Mode: External Baseline Rate (A): 130 bpm Variability: Moderate Accelerations: 15 x 15 Decelerations: None Multiple birth?: Yes  Uterine Activity Mode: Palpation, Toco Contraction Frequency (min): ui Resting Tone Palpated: Relaxed  Interpretation (Fetal Testing) Nonstress Test Interpretation: Reactive Overall Impression: Reassuring for gestational age Comments: Dr. Grace Bushy reviewed tracing.

## 2021-07-27 ENCOUNTER — Other Ambulatory Visit: Payer: Self-pay | Admitting: *Deleted

## 2021-07-30 ENCOUNTER — Inpatient Hospital Stay (HOSPITAL_COMMUNITY)
Admission: AD | Admit: 2021-07-30 | Discharge: 2021-07-30 | Disposition: A | Discharge status: Home / Self Care | Payer: Medicaid Other | Attending: Obstetrics and Gynecology | Admitting: Obstetrics and Gynecology

## 2021-07-30 DIAGNOSIS — R102 Pelvic and perineal pain: Secondary | ICD-10-CM | POA: Insufficient documentation

## 2021-07-30 DIAGNOSIS — O0990 Supervision of high risk pregnancy, unspecified, unspecified trimester: Secondary | ICD-10-CM

## 2021-07-30 DIAGNOSIS — R109 Unspecified abdominal pain: Secondary | ICD-10-CM | POA: Diagnosis not present

## 2021-07-30 DIAGNOSIS — O26893 Other specified pregnancy related conditions, third trimester: Secondary | ICD-10-CM | POA: Insufficient documentation

## 2021-07-30 DIAGNOSIS — M549 Dorsalgia, unspecified: Secondary | ICD-10-CM

## 2021-07-30 DIAGNOSIS — Z3A34 34 weeks gestation of pregnancy: Secondary | ICD-10-CM

## 2021-07-30 DIAGNOSIS — O30043 Twin pregnancy, dichorionic/diamniotic, third trimester: Secondary | ICD-10-CM

## 2021-07-30 DIAGNOSIS — O99891 Other specified diseases and conditions complicating pregnancy: Secondary | ICD-10-CM

## 2021-07-30 DIAGNOSIS — Z3689 Encounter for other specified antenatal screening: Secondary | ICD-10-CM

## 2021-07-30 LAB — URINALYSIS, ROUTINE W REFLEX MICROSCOPIC
Bilirubin Urine: NEGATIVE
Glucose, UA: NEGATIVE mg/dL
Hgb urine dipstick: NEGATIVE
Ketones, ur: NEGATIVE mg/dL
Leukocytes,Ua: NEGATIVE
Nitrite: NEGATIVE
Protein, ur: NEGATIVE mg/dL
Specific Gravity, Urine: 1.004 — ABNORMAL LOW (ref 1.005–1.030)
pH: 7 (ref 5.0–8.0)

## 2021-07-30 NOTE — MAU Provider Note (Signed)
History     CSN: [id number]  Arrival date and time: 07/30/21 2207   Event Date/Time   First Provider Initiated Contact with Patient 07/30/21 2307      Chief Complaint  Patient presents with   Abdominal Pain   Erin Harrison is a 26 y.o. G3P1011 at [redacted]w[redacted]d who receives care at [facility].  She presents today for Back Pain and Pelvic Pressure. She states the back pain started yesterday evening and states it is also in her abdominal area  She states it is a "constant pain pain pain."  She states it eased off and returned today.  She describes it as an aching sensation and some tightening that occurs in her abdomen.  She endorses fetal movement x 2 and denies "painful" contractions.  She reports the pelvic pressure has been present for one week and has worsened today causing some issues with ambulation and position changes. She reports having lost some of her mucous plug, but denies leaking, bleeding, or discharge. She rates her pain a 5/10 and denies relieving symptoms.     OB History     Gravida  3   Para  1   Term  1   Preterm      AB  1   Living  1      SAB      IAB  1   Ectopic      Multiple  0   Live Births  1           Past Medical History:  Diagnosis Date   Chlamydia    Headache    Hypoglycemia    Infection    UTI    Past Surgical History:  Procedure Laterality Date   DILATION AND CURETTAGE OF UTERUS     WISDOM TOOTH EXTRACTION      Family History  Problem Relation Age of Onset   Thyroid disease Mother    Diabetes Father    Stroke Father    Cancer Maternal Grandmother        breast   Diabetes Paternal Grandfather    Hypertension Paternal Grandfather     Social History   Tobacco Use   Smoking status: Never   Smokeless tobacco: Never  Vaping Use   Vaping Use: Never used  Substance Use Topics   Alcohol use: Not Currently    Comment: occ   Drug use: No    Allergies: No Known Allergies  Medications Prior to Admission  Medication  Sig Dispense Refill Last Dose   acetaminophen (TYLENOL) 500 MG tablet Take 2 tablets (1,000 mg total) by mouth every 6 (six) hours as needed. 30 tablet 3 Past Week   prenatal vitamin w/FE, FA (PRENATAL 1 + 1) 27-1 MG TABS tablet Take 1 tablet by mouth daily at 12 noon.   07/30/2021   cyclobenzaprine (FLEXERIL) 10 MG tablet Take 1 tablet (10 mg total) by mouth every 8 (eight) hours as needed for muscle spasms. 30 tablet 1    Doxylamine-Pyridoxine 10-10 MG TBEC Take 2 tablets by mouth at bedtime. (Patient not taking: Reported on 07/06/2021) 90 tablet 2    miconazole (MONISTAT 7 SIMPLY CURE) 2 % vaginal cream Place one applicatorful daily at bedtime x 7 days (Patient not taking: Reported on 06/08/2021) 45 g 0    terconazole (TERAZOL 7) 0.4 % vaginal cream Place 1 applicator vaginally at bedtime. Use for seven days (Patient not taking: Reported on 07/06/2021) 45 g 0     Review of Systems  Gastrointestinal:  Negative for constipation, diarrhea, nausea and vomiting. Abdominal pain: Occasional tightening, but no pain. Genitourinary:  Positive for pelvic pain (Pressure). Negative for difficulty urinating, dysuria, vaginal bleeding and vaginal discharge.  Musculoskeletal:  Positive for back pain.  Neurological:  Negative for dizziness, light-headedness and headaches.   Physical Exam   Blood pressure 133/76, pulse 79, temperature 97.9 F (36.6 C), temperature source Oral, resp. rate 16, height 5\' 3"  (1.6 m), weight 83.1 kg, SpO2 100 %, unknown if currently breastfeeding.  Physical Exam Vitals reviewed.  Constitutional:      Appearance: She is well-developed.  HENT:     Head: Normocephalic and atraumatic.  Eyes:     Conjunctiva/sclera: Conjunctivae normal.  Cardiovascular:     Rate and Rhythm: Normal rate.  Pulmonary:     Effort: Pulmonary effort is normal. No respiratory distress.  Abdominal:     Comments: Gravid, Appears LGA  Genitourinary:    Comments: Dilation: Closed Effacement (%):  50 Cervical Position: Posterior Exam by:: [email or web address], CNM  Musculoskeletal:        General: Normal range of motion.     Cervical back: Normal range of motion.  Skin:    General: Skin is warm and dry.  Neurological:     Mental Status: She is alert and oriented to person, place, and time.  Psychiatric:        Mood and Affect: Mood normal.        Behavior: Behavior normal.     Fetal Assessment -145 bpm, Mod Var, -Decels, +Accels -125 bpm, Mod Var, -Decels, +Accels Toco: Q min  MAU Course   Results for orders placed or performed during the hospital encounter of 07/30/21 (from the past 24 hour(s))  Urinalysis, Routine w reflex microscopic Urine, Clean Catch     Status: Abnormal   Collection Time: 07/30/21 10:21 PM  Result Value Ref Range   Color, Urine STRAW (A) YELLOW   APPearance CLEAR CLEAR   Specific Gravity, Urine 1.004 (L) 1.005 - 1.030   pH 7.0 5.0 - 8.0   Glucose, UA NEGATIVE NEGATIVE mg/dL   Hgb urine dipstick NEGATIVE NEGATIVE   Bilirubin Urine NEGATIVE NEGATIVE   Ketones, ur NEGATIVE NEGATIVE mg/dL   Protein, ur NEGATIVE NEGATIVE mg/dL   Nitrite NEGATIVE NEGATIVE   Leukocytes,Ua NEGATIVE NEGATIVE   No results found.  MDM PE Labs: UA EFM  Assessment and Plan  26 year old G3P1011  SIUP at 34.4 weeks Cat I FT x 2 Back Pain Pelvic Pressure  -Pain medication offered and declined -Discussed complaints and informed that they are expected pregnancy discomforts. -Reviewed measures for increased comfort including resting, hydration, heating pads, and tylenol usage when desired.  -POC Reviewed  -Exam performed. -Reassured that cervix is closed. Informed that loss of mucous plug is notable, but not indicative of labor onset. -Plan to discharge home with precautions. -Instructed to follow up as scheduled. Next appt Friday July 7th. -Encouraged to discuss birth plan with MD: Vaginal vs C/S.  Cautioned that sometimes C/S recommended when baby A is  smaller. -Patient questions when she will deliver and informed that this provider has no idea!  -Precautions reviewed. -NST reactive X 2 -Heating pads given for home use.  -Encouraged to call primary office or return to MAU if symptoms worsen or with the onset of new symptoms. -Discharged to home in stable condition.  10-15-1999 MSN, CNM 07/30/2021, 11:07 PM

## 2021-07-30 NOTE — MAU Note (Signed)
Erin Harrison is a 26 y.o. at [redacted]w[redacted]d here in MAU reporting: Lost mucous plug and having some pelvic pressure. Baby A is breech and is going to be scheduled for a c-section. +FM, no LOF LMP:  Onset of complaint: today Pain score: 5/10 Vitals:   07/30/21 2216  BP: 114/68  Pulse: 85  Resp: 16  Temp: 97.9 F (36.6 C)  SpO2: 100%     FHT:A-130, B - 137 Lab orders placed from triage: UA

## 2021-08-02 DIAGNOSIS — G43909 Migraine, unspecified, not intractable, without status migrainosus: Secondary | ICD-10-CM | POA: Insufficient documentation

## 2021-08-02 DIAGNOSIS — O321XX0 Maternal care for breech presentation, not applicable or unspecified: Secondary | ICD-10-CM | POA: Insufficient documentation

## 2021-08-02 DIAGNOSIS — O365911 Maternal care for other known or suspected poor fetal growth, first trimester, fetus 1: Secondary | ICD-10-CM | POA: Insufficient documentation

## 2021-08-02 NOTE — Progress Notes (Signed)
PRENATAL VISIT NOTE  Subjective:  Erin Harrison is a 26 y.o. G3P1011 at [redacted]w[redacted]d being seen today for ongoing prenatal care.  She is currently monitored for the following issues for this high-risk pregnancy and has Supervision of high risk pregnancy, antepartum; Twin pregnancy, twins dichorionic and diamniotic; Marginal insertion of umbilical cord affecting management of mother; Intrauterine growth restriction affecting antepartum care of mother in first trimester, fetus 1; Migraines; and Breech presentation on their problem list.  Patient reports occasional contractions.  Contractions: Irritability. Vag. Bleeding: None.  Movement: Present. Denies leaking of fluid.   The following portions of the patient's history were reviewed and updated as appropriate: allergies, current medications, past family history, past medical history, past social history, past surgical history and problem list.   Objective:   Vitals:   08/03/21 0820  BP: 111/74  Pulse: 81  Weight: 182 lb (82.6 kg)    Fetal Status: Fetal Heart Rate (bpm): 140/120   Movement: Present     General:  Alert, oriented and cooperative. Patient is in no acute distress.  Skin: Skin is warm and dry. No rash noted.   Cardiovascular: Normal heart rate noted  Respiratory: Normal respiratory effort, no problems with respiration noted  Abdomen: Soft, gravid, appropriate for gestational age.  Pain/Pressure: Present     Pelvic: Cervical exam performed in the presence of a chaperone Dilation: Fingertip Effacement (%): 0 Station: Ballotable  Extremities: Normal range of motion.  Edema: None  Mental Status: Normal mood and affect. Normal behavior. Normal judgment and thought content.   Assessment and Plan:  Pregnancy: G3P1011 at [redacted]w[redacted]d 1. Supervision of high risk pregnancy, antepartum - ROB or delivery in 1 week based of MFM recs.  2. Marginal insertion of umbilical cord affecting management of mother - [place] and Dopplers today - Check  presentation. Baby A Breech (Probably still breech per VE) last [place]. Will determine plans for MOD. Discussion with pt that is baby A is Vertex we can plan vaginal delivery but there is always a possibility of needing C/S for both or second baby. If baby A is not vertex, we recommend primary C/S. Pt hopes for vaginal birth but verbalizes understanding of possibly indications for C/S and agrees. MFM recommended delivery at 36-37 wks last week. Pt is nervous about FGR and growth discordance and uncomfortable with contractions and strongly prefers delivery at 36 weeks if possible.    3. Dichorionic diamniotic twin pregnancy in third trimester - Growth [place] and dopplers today  4. Intrauterine growth restriction affecting antepartum care of mother in first trimester, fetus 1 - Del 36-37 wks per MFM  5. Breech/transverse presentation 6/29.  - Check presentation today to determine plans for mode of delivery  Preterm labor symptoms and general obstetric precautions including but not limited to vaginal bleeding, contractions, leaking of fluid and fetal movement were reviewed in detail with the patient. Please refer to After Visit Summary for other counseling recommendations.   Return in about 1 week (around 08/10/2021) for ROB. depending on delivery plans.   Future Appointments  Date Time Provider Department Center  08/08/2021 11:30 AM [facility] NURSE [facility]  08/08/2021 11:45 AM [facility] US6 [facility]  08/10/2021  8:15 AM Warden Fillers, MD [facility]  08/17/2021  8:15 AM Adam Phenix, MD [facility]  08/24/2021  8:15 AM Warden Fillers, MD [facility]  08/31/2021 10:35 AM Jauca Bing, MD [facility]  09/07/2021 10:35 AM [place] Bing, MD [facility]    Koleen Nimrod  Tamala Julian, CNM

## 2021-08-03 ENCOUNTER — Encounter: Payer: Self-pay | Admitting: *Deleted

## 2021-08-03 ENCOUNTER — Ambulatory Visit: Payer: Medicaid Other

## 2021-08-03 ENCOUNTER — Ambulatory Visit: Payer: Medicaid Other | Attending: Obstetrics and Gynecology | Admitting: *Deleted

## 2021-08-03 ENCOUNTER — Ambulatory Visit (HOSPITAL_BASED_OUTPATIENT_CLINIC_OR_DEPARTMENT_OTHER): Payer: Medicaid Other

## 2021-08-03 ENCOUNTER — Ambulatory Visit (INDEPENDENT_AMBULATORY_CARE_PROVIDER_SITE_OTHER): Payer: Medicaid Other | Admitting: Advanced Practice Midwife

## 2021-08-03 ENCOUNTER — Encounter: Payer: Self-pay | Admitting: Obstetrics and Gynecology

## 2021-08-03 ENCOUNTER — Ambulatory Visit: Payer: Medicaid Other | Admitting: *Deleted

## 2021-08-03 ENCOUNTER — Other Ambulatory Visit: Payer: Self-pay | Admitting: Obstetrics and Gynecology

## 2021-08-03 ENCOUNTER — Other Ambulatory Visit: Payer: Self-pay

## 2021-08-03 VITALS — BP 123/73 | HR 98

## 2021-08-03 VITALS — BP 111/74 | HR 81 | Wt 182.0 lb

## 2021-08-03 DIAGNOSIS — O365932 Maternal care for other known or suspected poor fetal growth, third trimester, fetus 2: Secondary | ICD-10-CM | POA: Diagnosis not present

## 2021-08-03 DIAGNOSIS — O365911 Maternal care for other known or suspected poor fetal growth, first trimester, fetus 1: Secondary | ICD-10-CM

## 2021-08-03 DIAGNOSIS — Z3A35 35 weeks gestation of pregnancy: Secondary | ICD-10-CM | POA: Insufficient documentation

## 2021-08-03 DIAGNOSIS — O30043 Twin pregnancy, dichorionic/diamniotic, third trimester: Secondary | ICD-10-CM

## 2021-08-03 DIAGNOSIS — O283 Abnormal ultrasonic finding on antenatal screening of mother: Secondary | ICD-10-CM

## 2021-08-03 DIAGNOSIS — O365930 Maternal care for other known or suspected poor fetal growth, third trimester, not applicable or unspecified: Secondary | ICD-10-CM | POA: Diagnosis not present

## 2021-08-03 DIAGNOSIS — O365931 Maternal care for other known or suspected poor fetal growth, third trimester, fetus 1: Secondary | ICD-10-CM | POA: Insufficient documentation

## 2021-08-03 DIAGNOSIS — O43193 Other malformation of placenta, third trimester: Secondary | ICD-10-CM | POA: Diagnosis not present

## 2021-08-03 DIAGNOSIS — O0990 Supervision of high risk pregnancy, unspecified, unspecified trimester: Secondary | ICD-10-CM

## 2021-08-03 DIAGNOSIS — O358XX0 Maternal care for other (suspected) fetal abnormality and damage, not applicable or unspecified: Secondary | ICD-10-CM

## 2021-08-03 DIAGNOSIS — K219 Gastro-esophageal reflux disease without esophagitis: Secondary | ICD-10-CM

## 2021-08-03 DIAGNOSIS — O321XX1 Maternal care for breech presentation, fetus 1: Secondary | ICD-10-CM

## 2021-08-03 DIAGNOSIS — O43199 Other malformation of placenta, unspecified trimester: Secondary | ICD-10-CM

## 2021-08-03 DIAGNOSIS — O99613 Diseases of the digestive system complicating pregnancy, third trimester: Secondary | ICD-10-CM

## 2021-08-03 MED ORDER — FAMOTIDINE 20 MG PO TABS: 20.0000 mg | ORAL_TABLET | Freq: Two times a day (BID) | ORAL | 2 refills | Status: DC

## 2021-08-03 NOTE — Procedures (Signed)
Tkeya Rohlfs May 28, 1995 [redacted]w[redacted]d   Fetus B Non-Stress Test Interpretation for 08/03/21  Indication:  di di twins  Fetal Heart Rate Fetus B Mode: External, Doppler Baseline Rate (B): 120 BPM Variability: Moderate Accelerations: 15 x 15 Decelerations: None  Uterine Activity Mode: Palpation, Toco Contraction Frequency (min): ui Resting Tone Palpated: Relaxed  Interpretation (Baby B - Fetal Testing) Nonstress Test Interpretation (Baby B): Reactive Overall Impression (Baby B): Reassuring for gestational age Comments (Baby B): Dr. Parke Poisson reviewed tracing  Angelik Walls 1995/02/24 [redacted]w[redacted]d  Fetus A Non-Stress Test Interpretation for 08/03/21  Indication: IUGR  Fetal Heart Rate A Mode: External Baseline Rate (A): 140 bpm Variability: Moderate Accelerations: 15 x 15 Decelerations: None Multiple birth?: Yes  Uterine Activity Mode: Palpation, Toco Contraction Frequency (min): ui Resting Tone Palpated: Relaxed  Interpretation (Fetal Testing) Nonstress Test Interpretation: Reactive Overall Impression: Reassuring for gestational age Comments: Dr. Parke Poisson reviewed tracing

## 2021-08-04 ENCOUNTER — Encounter: Payer: Self-pay | Admitting: Advanced Practice Midwife

## 2021-08-06 ENCOUNTER — Other Ambulatory Visit: Payer: Self-pay | Admitting: Obstetrics and Gynecology

## 2021-08-06 ENCOUNTER — Encounter: Payer: Self-pay | Admitting: *Deleted

## 2021-08-06 ENCOUNTER — Ambulatory Visit (INDEPENDENT_AMBULATORY_CARE_PROVIDER_SITE_OTHER): Payer: Medicaid Other | Admitting: General Practice

## 2021-08-06 ENCOUNTER — Telehealth: Payer: Self-pay

## 2021-08-06 ENCOUNTER — Other Ambulatory Visit: Payer: Self-pay

## 2021-08-06 ENCOUNTER — Telehealth: Payer: Self-pay | Admitting: *Deleted

## 2021-08-06 VITALS — BP 126/79 | HR 98 | Ht 62.0 in | Wt 183.0 lb

## 2021-08-06 DIAGNOSIS — O365911 Maternal care for other known or suspected poor fetal growth, first trimester, fetus 1: Secondary | ICD-10-CM

## 2021-08-06 DIAGNOSIS — O0990 Supervision of high risk pregnancy, unspecified, unspecified trimester: Secondary | ICD-10-CM

## 2021-08-06 NOTE — Telephone Encounter (Signed)
Called patient, surgery date, time, location and preop instructions given, patient expressed understanding. 

## 2021-08-06 NOTE — Telephone Encounter (Signed)
Received information from Erin Harrison,CNM after she reviewed patient question about c/s. Instructed to let patient know we are sticking with plan per MFM for delivery between 36-37 weeks with scheduled C/S 08/09/21. Also to get patient in for self swab for GBS asap today if possible. Patient was notified by scheduler this am of C/S appointment. I called Courtnay but did not reach her. Will send MyChart message also. Nancy Fetter

## 2021-08-06 NOTE — Progress Notes (Signed)
Patient presents to office today for GBS self swab collection. Patient was instructed in self swab and specimen collected. Patient will follow up at hospital on 7/12 for c-section.  Chase Caller RN BSN 08/06/21

## 2021-08-06 NOTE — Telephone Encounter (Signed)
Patient called back and agreed to do her best to come in for GBS self swab at 4:15 today. Nancy Fetter

## 2021-08-08 ENCOUNTER — Ambulatory Visit (HOSPITAL_BASED_OUTPATIENT_CLINIC_OR_DEPARTMENT_OTHER): Payer: Medicaid Other | Admitting: *Deleted

## 2021-08-08 ENCOUNTER — Other Ambulatory Visit: Payer: Self-pay | Admitting: Maternal & Fetal Medicine

## 2021-08-08 ENCOUNTER — Ambulatory Visit (HOSPITAL_BASED_OUTPATIENT_CLINIC_OR_DEPARTMENT_OTHER): Payer: Medicaid Other

## 2021-08-08 ENCOUNTER — Ambulatory Visit: Payer: Medicaid Other | Admitting: *Deleted

## 2021-08-08 VITALS — BP 112/70 | HR 96

## 2021-08-08 DIAGNOSIS — O365931 Maternal care for other known or suspected poor fetal growth, third trimester, fetus 1: Secondary | ICD-10-CM | POA: Insufficient documentation

## 2021-08-08 DIAGNOSIS — O30043 Twin pregnancy, dichorionic/diamniotic, third trimester: Secondary | ICD-10-CM | POA: Insufficient documentation

## 2021-08-08 DIAGNOSIS — O283 Abnormal ultrasonic finding on antenatal screening of mother: Secondary | ICD-10-CM

## 2021-08-08 DIAGNOSIS — O365911 Maternal care for other known or suspected poor fetal growth, first trimester, fetus 1: Secondary | ICD-10-CM | POA: Insufficient documentation

## 2021-08-08 DIAGNOSIS — Z3A35 35 weeks gestation of pregnancy: Secondary | ICD-10-CM

## 2021-08-08 DIAGNOSIS — O365930 Maternal care for other known or suspected poor fetal growth, third trimester, not applicable or unspecified: Secondary | ICD-10-CM | POA: Insufficient documentation

## 2021-08-08 DIAGNOSIS — O358XX0 Maternal care for other (suspected) fetal abnormality and damage, not applicable or unspecified: Secondary | ICD-10-CM

## 2021-08-08 DIAGNOSIS — O0990 Supervision of high risk pregnancy, unspecified, unspecified trimester: Secondary | ICD-10-CM | POA: Insufficient documentation

## 2021-08-08 NOTE — H&P (Signed)
OBSTETRIC ADMISSION HISTORY AND PHYSICAL  Fumie Fiallo is a 26 y.o. female G3P1011 with IUP at [redacted]w[redacted]d by 13 week [place] presenting for scheduled cesarean section due to FGR, twins.   No decreased FM or labor s/s.  She received her prenatal care at [facility].   Dating: By [place] --->  Estimated Date of Delivery: 09/06/21  Bpp reassuring x 2 year with normal UA dopplers for both. A was still breech  Sono:   @[redacted]w[redacted]d , normal anatomy, breech presentation for baby A, transverse presentation for baby B, anterior placental lie, 1877 g for baby A, 4.9% EFW, 2578 g for baby B, 75% EFW (discordance 27%)  Prenatal History/Complications:  Dichorionic, diamniotic twin pregnancy  FGR of twin A Marginal cord insertion   Past Medical History: Past Medical History:  Diagnosis Date   Chlamydia    Headache    Hypoglycemia    Infection    UTI    Past Surgical History: Past Surgical History:  Procedure Laterality Date   DILATION AND CURETTAGE OF UTERUS     WISDOM TOOTH EXTRACTION      Obstetrical History: OB History     Gravida  3   Para  1   Term  1   Preterm      AB  1   Living  1      SAB      IAB  1   Ectopic      Multiple  0   Live Births  1           Social History Social History   Socioeconomic History   Marital status: Single    Spouse name: Not on file   Number of children: Not on file   Years of education: Not on file   Highest education level: Not on file  Occupational History   Not on file  Tobacco Use   Smoking status: Never   Smokeless tobacco: Never  Vaping Use   Vaping Use: Never used  Substance and Sexual Activity   Alcohol use: Not Currently    Comment: occ   Drug use: No   Sexual activity: Yes    Birth control/protection: None  Other Topics Concern   Not on file  Social History Narrative   Not on file   Social Determinants of Health   Financial Resource Strain: Not on file  Food Insecurity: No Food Insecurity (02/23/2021)   Hunger  Vital Sign    Worried About Running Out of Food in the Last Year: Never true    Ran Out of Food in the Last Year: Never true  Transportation Needs: No Transportation Needs (02/23/2021)   PRAPARE - 02/25/2021 (Medical): No    Lack of Transportation (Non-Medical): No  Physical Activity: Not on file  Stress: Not on file  Social Connections: Not on file    Family History: Family History  Problem Relation Age of Onset   Thyroid disease Mother    Diabetes Father    Stroke Father    Cancer Maternal Grandmother        breast   Diabetes Paternal Grandfather    Hypertension Paternal Grandfather     Allergies: No Known Allergies  Medications Prior to Admission  Medication Sig Dispense Refill Last Dose   prenatal vitamin w/FE, FA (PRENATAL 1 + 1) 27-1 MG TABS tablet Take 1 tablet by mouth in the morning and at bedtime.        Review  of Systems  All systems reviewed and negative except as stated in HPI  unknown if currently breastfeeding.  FHTs wnl x 2 during h&p  General appearance: alert, cooperative, and no distress Lungs: normal work of breathing on room air  Heart: normal rate, warm and well perfused  Abdomen: soft, non-tender, gravid  Extremities: no LE edema or calf tenderness to palpation    Prenatal labs: ABO, Rh: B/Positive/-- (01/27 1021) Antibody: Negative (01/27 1021) Rubella: 4.74 (01/27 1021) RPR: Non Reactive (05/12 0831)  HBsAg: Negative (01/27 1021)  HIV: Non Reactive (05/12 0831)  GBS:  Collected and pending  2 hr Glucola normal  Genetic screening - LR NIPS, Horizon negative  Anatomy US normal   Prenatal Transfer Tool  Maternal Diabetes: No Genetic Screening: Normal Maternal Ultrasounds/Referrals: FGR of twin A Fetal Ultrasounds or other Referrals:  Referred to Materal Fetal Medicine  Maternal Substance Abuse:  No Significant Maternal Medications:  None Significant Maternal Lab Results: None  No results found for  this or any previous visit (from the past 24 hour(s)).  Patient Active Problem List   Diagnosis Date Noted   Intrauterine growth restriction affecting antepartum care of mother in first trimester, fetus 1 08/02/2021   Migraines 08/02/2021   Breech presentation 08/02/2021   Marginal insertion of umbilical cord affecting management of mother 04/17/2021   Supervision of high risk pregnancy, antepartum 02/13/2021   Twin pregnancy, twins dichorionic and diamniotic 02/13/2021    Assessment/Plan:  Moranda Billiot is a 26 y.o. G3P1011 at [redacted]w[redacted]d here for scheduled cesarean section.  The risks of surgery were discussed with the patient including but were not limited to: bleeding which may require transfusion or reoperation; infection which may require antibiotics; injury to bowel, bladder, ureters or other surrounding organs; injury to the fetus; need for additional procedures including hysterectomy in the event of a life-threatening hemorrhage; formation of adhesions; placental abnormalities with subsequent pregnancies; incisional problems; thromboembolic phenomenon and other postoperative/anesthesia complications.     I asked her if she wanted a c/s regardless of A's presentation and she said that she did  Anesthesia and OR aware. Preoperative prophylactic antibiotics and SCDs ordered on call to the OR.  To OR when labs are back   Patient desires Nexplanon  #ID:  GBS pending; Ancef for surgical prophylaxis   Worthy Rancher, MD  08/09/2021, 10:39 AM

## 2021-08-08 NOTE — Procedures (Signed)
Erin Harrison May 25, 1995 [redacted]w[redacted]d   Fetus B Non-Stress Test Interpretation for 08/08/21  Indication: Unsatisfactory BPP  Fetal Heart Rate Fetus B Mode: External Baseline Rate (B): 130 BPM Variability: Moderate Accelerations: 15 x 15 Decelerations: None  Uterine Activity Mode: Palpation, Toco Contraction Frequency (min): occ with ui Contraction Duration (sec): 50-70 Contraction Quality: Mild Resting Tone Palpated: Relaxed Resting Time: Adequate  Interpretation (Baby B - Fetal Testing) Nonstress Test Interpretation (Baby B): Reactive Overall Impression (Baby B): Reassuring for gestational age Comments (Baby B): Dr. Grace Bushy reviewd tracing  Erin Harrison 07-Aug-1995 [redacted]w[redacted]d  Fetus A Non-Stress Test Interpretation for 08/08/21  Indication: IUGR  Fetal Heart Rate A Mode: External Baseline Rate (A): 145 bpm Variability: Moderate Accelerations: 15 x 15 Decelerations: None Multiple birth?: Yes  Uterine Activity Mode: Palpation, Toco Contraction Frequency (min): occ with ui Contraction Duration (sec): 50-70 Contraction Quality: Mild Resting Tone Palpated: Relaxed Resting Time: Adequate  Interpretation (Fetal Testing) Nonstress Test Interpretation: Reactive Overall Impression: Reassuring for gestational age Comments: Dr. Grace Bushy reviewed tracing

## 2021-08-09 ENCOUNTER — Other Ambulatory Visit: Payer: Self-pay

## 2021-08-09 ENCOUNTER — Encounter (HOSPITAL_COMMUNITY): Payer: Self-pay | Admitting: Obstetrics and Gynecology

## 2021-08-09 ENCOUNTER — Inpatient Hospital Stay (HOSPITAL_COMMUNITY)
Admission: RE | Admit: 2021-08-09 | Discharge: 2021-08-12 | DRG: 788 | Disposition: A | Discharge status: Home / Self Care | Payer: Medicaid Other | Attending: Obstetrics and Gynecology | Admitting: Obstetrics and Gynecology

## 2021-08-09 ENCOUNTER — Inpatient Hospital Stay (HOSPITAL_COMMUNITY): Payer: Medicaid Other | Admitting: Anesthesiology

## 2021-08-09 ENCOUNTER — Encounter (HOSPITAL_COMMUNITY)
Admission: RE | Disposition: A | Discharge status: Home / Self Care | Payer: Self-pay | Source: Home / Self Care | Attending: Obstetrics and Gynecology

## 2021-08-09 DIAGNOSIS — O321XX0 Maternal care for breech presentation, not applicable or unspecified: Secondary | ICD-10-CM | POA: Diagnosis not present

## 2021-08-09 DIAGNOSIS — O329XX0 Maternal care for malpresentation of fetus, unspecified, not applicable or unspecified: Secondary | ICD-10-CM | POA: Diagnosis present

## 2021-08-09 DIAGNOSIS — Z98891 History of uterine scar from previous surgery: Secondary | ICD-10-CM

## 2021-08-09 DIAGNOSIS — O365931 Maternal care for other known or suspected poor fetal growth, third trimester, fetus 1: Secondary | ICD-10-CM | POA: Diagnosis present

## 2021-08-09 DIAGNOSIS — O365930 Maternal care for other known or suspected poor fetal growth, third trimester, not applicable or unspecified: Secondary | ICD-10-CM | POA: Diagnosis not present

## 2021-08-09 DIAGNOSIS — O365911 Maternal care for other known or suspected poor fetal growth, first trimester, fetus 1: Secondary | ICD-10-CM

## 2021-08-09 DIAGNOSIS — O321XX1 Maternal care for breech presentation, fetus 1: Principal | ICD-10-CM | POA: Diagnosis present

## 2021-08-09 DIAGNOSIS — O43199 Other malformation of placenta, unspecified trimester: Secondary | ICD-10-CM | POA: Diagnosis present

## 2021-08-09 DIAGNOSIS — O43123 Velamentous insertion of umbilical cord, third trimester: Secondary | ICD-10-CM | POA: Diagnosis present

## 2021-08-09 DIAGNOSIS — O322XX2 Maternal care for transverse and oblique lie, fetus 2: Secondary | ICD-10-CM | POA: Diagnosis present

## 2021-08-09 DIAGNOSIS — Z3A36 36 weeks gestation of pregnancy: Secondary | ICD-10-CM

## 2021-08-09 DIAGNOSIS — O365990 Maternal care for other known or suspected poor fetal growth, unspecified trimester, not applicable or unspecified: Secondary | ICD-10-CM | POA: Diagnosis present

## 2021-08-09 DIAGNOSIS — O30043 Twin pregnancy, dichorionic/diamniotic, third trimester: Secondary | ICD-10-CM

## 2021-08-09 DIAGNOSIS — O0990 Supervision of high risk pregnancy, unspecified, unspecified trimester: Secondary | ICD-10-CM

## 2021-08-09 DIAGNOSIS — O30049 Twin pregnancy, dichorionic/diamniotic, unspecified trimester: Secondary | ICD-10-CM | POA: Diagnosis present

## 2021-08-09 DIAGNOSIS — Z30017 Encounter for initial prescription of implantable subdermal contraceptive: Secondary | ICD-10-CM

## 2021-08-09 DIAGNOSIS — Z3A35 35 weeks gestation of pregnancy: Secondary | ICD-10-CM

## 2021-08-09 LAB — CBC
HCT: 32.6 % — ABNORMAL LOW (ref 36.0–46.0)
Hemoglobin: 10.9 g/dL — ABNORMAL LOW (ref 12.0–15.0)
MCH: 27.7 pg (ref 26.0–34.0)
MCHC: 33.4 g/dL (ref 30.0–36.0)
MCV: 83 fL (ref 80.0–100.0)
Platelets: 196 10*3/uL (ref 150–400)
RBC: 3.93 MIL/uL (ref 3.87–5.11)
RDW: 12.7 % (ref 11.5–15.5)
WBC: 7 10*3/uL (ref 4.0–10.5)
nRBC: 0 % (ref 0.0–0.2)

## 2021-08-09 LAB — TYPE AND SCREEN
ABO/RH(D): B POS
Antibody Screen: NEGATIVE

## 2021-08-09 SURGERY — Surgical Case
Anesthesia: Epidural

## 2021-08-09 MED ORDER — MEPERIDINE HCL 25 MG/ML IJ SOLN: 6.2500 mg | INTRAMUSCULAR | Status: DC | PRN

## 2021-08-09 MED ORDER — ONDANSETRON HCL 4 MG/2ML IJ SOLN
INTRAMUSCULAR | Status: DC | PRN
  Administered 2021-08-09: 4 mg via INTRAVENOUS

## 2021-08-09 MED ORDER — DIPHENHYDRAMINE HCL 25 MG PO CAPS
25.0000 mg | ORAL_CAPSULE | Freq: Four times a day (QID) | ORAL | Status: DC | PRN
  Administered 2021-08-10: 25 mg via ORAL
  Filled 2021-08-09: qty 1

## 2021-08-09 MED ORDER — TETANUS-DIPHTH-ACELL PERTUSSIS 5-2.5-18.5 LF-MCG/0.5 IM SUSY: 0.5000 mL | PREFILLED_SYRINGE | Freq: Once | INTRAMUSCULAR | Status: DC

## 2021-08-09 MED ORDER — CEFAZOLIN SODIUM-DEXTROSE 2-4 GM/100ML-% IV SOLN
2.0000 g | INTRAVENOUS | Status: AC
  Administered 2021-08-09: 2 g via INTRAVENOUS

## 2021-08-09 MED ORDER — MORPHINE SULFATE (PF) 0.5 MG/ML IJ SOLN
INTRAMUSCULAR | Status: DC | PRN
  Administered 2021-08-09: 150 ug via INTRATHECAL

## 2021-08-09 MED ORDER — PHENYLEPHRINE 80 MCG/ML (10ML) SYRINGE FOR IV PUSH (FOR BLOOD PRESSURE SUPPORT)
PREFILLED_SYRINGE | INTRAVENOUS | Status: AC
  Filled 2021-08-09: qty 10

## 2021-08-09 MED ORDER — ZOLPIDEM TARTRATE 5 MG PO TABS: 5.0000 mg | ORAL_TABLET | Freq: Every evening | ORAL | Status: DC | PRN

## 2021-08-09 MED ORDER — PHENYLEPHRINE HCL-NACL 20-0.9 MG/250ML-% IV SOLN
INTRAVENOUS | Status: DC | PRN
  Administered 2021-08-09: 60 ug/min via INTRAVENOUS

## 2021-08-09 MED ORDER — SODIUM CHLORIDE 0.9% FLUSH: 3.0000 mL | INTRAVENOUS | Status: DC | PRN

## 2021-08-09 MED ORDER — IBUPROFEN 600 MG PO TABS
600.0000 mg | ORAL_TABLET | Freq: Four times a day (QID) | ORAL | Status: AC
  Administered 2021-08-09 – 2021-08-12 (×12): 600 mg via ORAL
  Filled 2021-08-09 (×11): qty 1

## 2021-08-09 MED ORDER — ENOXAPARIN SODIUM 40 MG/0.4ML IJ SOSY
40.0000 mg | PREFILLED_SYRINGE | INTRAMUSCULAR | Status: DC
Start: 2021-08-10 — End: 2021-08-13
  Administered 2021-08-10 – 2021-08-12 (×3): 40 mg via SUBCUTANEOUS
  Filled 2021-08-09 (×3): qty 0.4

## 2021-08-09 MED ORDER — DEXAMETHASONE SODIUM PHOSPHATE 4 MG/ML IJ SOLN
INTRAMUSCULAR | Status: AC
  Filled 2021-08-09: qty 2

## 2021-08-09 MED ORDER — FENTANYL CITRATE (PF) 100 MCG/2ML IJ SOLN
INTRAMUSCULAR | Status: DC | PRN
Start: 2021-08-09 — End: 2021-08-09
  Administered 2021-08-09: 15 ug via INTRATHECAL

## 2021-08-09 MED ORDER — PHENYLEPHRINE HCL (PRESSORS) 10 MG/ML IV SOLN
INTRAVENOUS | Status: DC | PRN
  Administered 2021-08-09 (×3): 80 ug via INTRAVENOUS

## 2021-08-09 MED ORDER — CEFAZOLIN SODIUM-DEXTROSE 2-4 GM/100ML-% IV SOLN
INTRAVENOUS | Status: AC
  Filled 2021-08-09: qty 100

## 2021-08-09 MED ORDER — FENTANYL CITRATE (PF) 100 MCG/2ML IJ SOLN
INTRAMUSCULAR | Status: AC
  Filled 2021-08-09: qty 2

## 2021-08-09 MED ORDER — MENTHOL 3 MG MT LOZG: 1.0000 | LOZENGE | OROMUCOSAL | Status: DC | PRN

## 2021-08-09 MED ORDER — OXYCODONE HCL 5 MG PO TABS
5.0000 mg | ORAL_TABLET | ORAL | Status: DC | PRN
  Administered 2021-08-10 – 2021-08-11 (×5): 5 mg via ORAL
  Administered 2021-08-12: 10 mg via ORAL
  Administered 2021-08-12: 5 mg via ORAL
  Filled 2021-08-09 (×6): qty 1

## 2021-08-09 MED ORDER — PHENYLEPHRINE HCL-NACL 20-0.9 MG/250ML-% IV SOLN
INTRAVENOUS | Status: AC
  Filled 2021-08-09: qty 250

## 2021-08-09 MED ORDER — METOCLOPRAMIDE HCL 5 MG/ML IJ SOLN
INTRAMUSCULAR | Status: AC
  Filled 2021-08-09: qty 2

## 2021-08-09 MED ORDER — GABAPENTIN 100 MG PO CAPS
100.0000 mg | ORAL_CAPSULE | Freq: Two times a day (BID) | ORAL | Status: DC
  Administered 2021-08-09 – 2021-08-12 (×6): 100 mg via ORAL
  Filled 2021-08-09 (×6): qty 1

## 2021-08-09 MED ORDER — DIBUCAINE (PERIANAL) 1 % EX OINT: 1.0000 | TOPICAL_OINTMENT | CUTANEOUS | Status: DC | PRN

## 2021-08-09 MED ORDER — HYDROMORPHONE HCL 1 MG/ML IJ SOLN: 0.2500 mg | INTRAMUSCULAR | Status: DC | PRN

## 2021-08-09 MED ORDER — DEXAMETHASONE SODIUM PHOSPHATE 4 MG/ML IJ SOLN
INTRAMUSCULAR | Status: DC | PRN
  Administered 2021-08-09: 8 mg via INTRAVENOUS

## 2021-08-09 MED ORDER — LACTATED RINGERS IV SOLN: INTRAVENOUS | Status: DC

## 2021-08-09 MED ORDER — OXYTOCIN-SODIUM CHLORIDE 30-0.9 UT/500ML-% IV SOLN
2.5000 [IU]/h | INTRAVENOUS | Status: AC
  Administered 2021-08-09: 2.5 [IU]/h via INTRAVENOUS
  Filled 2021-08-09: qty 500

## 2021-08-09 MED ORDER — OXYTOCIN-SODIUM CHLORIDE 30-0.9 UT/500ML-% IV SOLN
INTRAVENOUS | Status: AC
  Filled 2021-08-09: qty 500

## 2021-08-09 MED ORDER — OXYCODONE HCL 5 MG PO TABS: 5.0000 mg | ORAL_TABLET | Freq: Once | ORAL | Status: DC | PRN

## 2021-08-09 MED ORDER — KETOROLAC TROMETHAMINE 30 MG/ML IJ SOLN
30.0000 mg | Freq: Once | INTRAMUSCULAR | Status: AC | PRN
  Administered 2021-08-09: 30 mg via INTRAVENOUS

## 2021-08-09 MED ORDER — ACETAMINOPHEN 10 MG/ML IV SOLN
INTRAVENOUS | Status: DC | PRN
  Administered 2021-08-09: 1000 mg via INTRAVENOUS

## 2021-08-09 MED ORDER — NALOXONE HCL 4 MG/10ML IJ SOLN: 1.0000 ug/kg/h | INTRAVENOUS | Status: DC | PRN

## 2021-08-09 MED ORDER — ACETAMINOPHEN 10 MG/ML IV SOLN
INTRAVENOUS | Status: AC
  Filled 2021-08-09: qty 100

## 2021-08-09 MED ORDER — DIPHENHYDRAMINE HCL 50 MG/ML IJ SOLN
12.5000 mg | INTRAMUSCULAR | Status: DC | PRN
  Administered 2021-08-09: 12.5 mg via INTRAVENOUS

## 2021-08-09 MED ORDER — WITCH HAZEL-GLYCERIN EX PADS: 1.0000 | MEDICATED_PAD | CUTANEOUS | Status: DC | PRN

## 2021-08-09 MED ORDER — DIPHENHYDRAMINE HCL 25 MG PO CAPS: 25.0000 mg | ORAL_CAPSULE | ORAL | Status: DC | PRN

## 2021-08-09 MED ORDER — NALBUPHINE HCL 10 MG/ML IJ SOLN
10.0000 mg | INTRAMUSCULAR | Status: DC | PRN
  Administered 2021-08-09 – 2021-08-10 (×3): 10 mg via INTRAVENOUS
  Filled 2021-08-09 (×3): qty 1

## 2021-08-09 MED ORDER — TRANEXAMIC ACID-NACL 1000-0.7 MG/100ML-% IV SOLN
INTRAVENOUS | Status: AC
  Filled 2021-08-09: qty 100

## 2021-08-09 MED ORDER — OXYCODONE HCL 5 MG/5ML PO SOLN: 5.0000 mg | Freq: Once | ORAL | Status: DC | PRN

## 2021-08-09 MED ORDER — PROMETHAZINE HCL 25 MG/ML IJ SOLN: 6.2500 mg | INTRAMUSCULAR | Status: DC | PRN

## 2021-08-09 MED ORDER — ONDANSETRON HCL 4 MG/2ML IJ SOLN
INTRAMUSCULAR | Status: AC
  Filled 2021-08-09: qty 2

## 2021-08-09 MED ORDER — SOD CITRATE-CITRIC ACID 500-334 MG/5ML PO SOLN
30.0000 mL | ORAL | Status: AC
  Administered 2021-08-09: 30 mL via ORAL

## 2021-08-09 MED ORDER — MORPHINE SULFATE (PF) 0.5 MG/ML IJ SOLN
INTRAMUSCULAR | Status: AC
  Filled 2021-08-09: qty 10

## 2021-08-09 MED ORDER — PRENATAL MULTIVITAMIN CH
1.0000 | ORAL_TABLET | Freq: Every day | ORAL | Status: DC
  Administered 2021-08-10 – 2021-08-11 (×2): 1 via ORAL
  Filled 2021-08-09 (×3): qty 1

## 2021-08-09 MED ORDER — NALOXONE HCL 0.4 MG/ML IJ SOLN: 0.4000 mg | INTRAMUSCULAR | Status: DC | PRN

## 2021-08-09 MED ORDER — OXYTOCIN-SODIUM CHLORIDE 30-0.9 UT/500ML-% IV SOLN
INTRAVENOUS | Status: DC | PRN
  Administered 2021-08-09: 300 mL via INTRAVENOUS

## 2021-08-09 MED ORDER — SIMETHICONE 80 MG PO CHEW
80.0000 mg | CHEWABLE_TABLET | Freq: Three times a day (TID) | ORAL | Status: DC
  Administered 2021-08-10 – 2021-08-12 (×8): 80 mg via ORAL
  Filled 2021-08-09 (×8): qty 1

## 2021-08-09 MED ORDER — BUPIVACAINE IN DEXTROSE 0.75-8.25 % IT SOLN
INTRATHECAL | Status: DC | PRN
  Administered 2021-08-09: 1.6 mL via INTRATHECAL

## 2021-08-09 MED ORDER — COCONUT OIL OIL: 1.0000 | TOPICAL_OIL | Status: DC | PRN

## 2021-08-09 MED ORDER — SOD CITRATE-CITRIC ACID 500-334 MG/5ML PO SOLN
ORAL | Status: AC
  Filled 2021-08-09: qty 30

## 2021-08-09 SURGICAL SUPPLY — 34 items
BENZOIN TINCTURE PRP APPL 2/3 (GAUZE/BANDAGES/DRESSINGS) ×2 IMPLANT
CANISTER SUCT 3000ML PPV (MISCELLANEOUS) ×2 IMPLANT
CHLORAPREP W/TINT 26ML (MISCELLANEOUS) ×4 IMPLANT
CLAMP UMBILICAL CORD (MISCELLANEOUS) ×2 IMPLANT
CLOSURE STERI STRIP 1/2 X4 (GAUZE/BANDAGES/DRESSINGS) ×1 IMPLANT
DRSG OPSITE POSTOP 4X10 (GAUZE/BANDAGES/DRESSINGS) ×2 IMPLANT
ELECT REM PT RETURN 9FT ADLT (ELECTROSURGICAL) ×2
ELECTRODE REM PT RTRN 9FT ADLT (ELECTROSURGICAL) ×1 IMPLANT
EXTRACTOR VACUUM KIWI (MISCELLANEOUS) ×2 IMPLANT
GLOVE BIOGEL PI IND STRL 7.0 (GLOVE) ×2 IMPLANT
GLOVE BIOGEL PI IND STRL 7.5 (GLOVE) ×1 IMPLANT
GLOVE BIOGEL PI INDICATOR 7.0 (GLOVE) ×2
GLOVE BIOGEL PI INDICATOR 7.5 (GLOVE) ×1
GLOVE SKINSENSE NS SZ7.0 (GLOVE) ×1
GLOVE SKINSENSE STRL SZ7.0 (GLOVE) ×1 IMPLANT
GOWN STRL REUS W/ TWL LRG LVL3 (GOWN DISPOSABLE) ×2 IMPLANT
GOWN STRL REUS W/ TWL XL LVL3 (GOWN DISPOSABLE) ×1 IMPLANT
GOWN STRL REUS W/TWL LRG LVL3 (GOWN DISPOSABLE) ×2
GOWN STRL REUS W/TWL XL LVL3 (GOWN DISPOSABLE) ×1
NS IRRIG 1000ML POUR BTL (IV SOLUTION) ×2 IMPLANT
PACK C SECTION WH (CUSTOM PROCEDURE TRAY) ×2 IMPLANT
PAD ABD 7.5X8 STRL (GAUZE/BANDAGES/DRESSINGS) ×2 IMPLANT
PAD OB MATERNITY 4.3X12.25 (PERSONAL CARE ITEMS) ×2 IMPLANT
PAD PREP 24X48 CUFFED NSTRL (MISCELLANEOUS) ×2 IMPLANT
STRIP CLOSURE SKIN 1/2X4 (GAUZE/BANDAGES/DRESSINGS) ×2 IMPLANT
SUT MNCRL 0 VIOLET CTX 36 (SUTURE) ×2 IMPLANT
SUT MON AB 4-0 PS1 27 (SUTURE) ×2 IMPLANT
SUT MONOCRYL 0 CTX 36 (SUTURE) ×2
SUT PLAIN 2 0 XLH (SUTURE) ×2 IMPLANT
SUT VIC AB 0 CT1 36 (SUTURE) ×4 IMPLANT
SUT VIC AB 3-0 CT1 27 (SUTURE) ×1
SUT VIC AB 3-0 CT1 TAPERPNT 27 (SUTURE) ×1 IMPLANT
TOWEL OR 17X24 6PK STRL BLUE (TOWEL DISPOSABLE) ×4 IMPLANT
WATER STERILE IRR 1000ML POUR (IV SOLUTION) ×2 IMPLANT

## 2021-08-09 NOTE — Op Note (Signed)
Operative Note   SURGERY DATE: 08/09/2021  PRE-OP DIAGNOSIS:  *Pregnancy at 36/0 *Dichorionic-Diamniotic twins *FGR of twin A *Malpresentation of twin A  POST-OP DIAGNOSIS: Same. Delivered   PROCEDURE: primary low transverse cesarean section via Claretha Cooper skin incision with double layer uterine closure  SURGEON: Surgeon(s) and Role:    * [place] Bing, MD - Primary  ASSISTANT:    Worthy Rancher, MD - Assisting  ANESTHESIA: spinal  ESTIMATED BLOOD LOSS:   DRAINS: UOP via indwelling foley  TOTAL IV FLUIDS: per anesthesia note   VTE PROPHYLAXIS: SCDs to bilateral lower extremities  ANTIBIOTICS: Two grams of Cefazolin were given., within 1 hour of skin incision  SPECIMENS: placentas to pathology  COMPLICATIONS: None  FINDINGS: No intra-abdominal adhesions were noted. Grossly normal uterus, tubes and ovaries.  A: Clear amniotic fluid, breech,  female infant, weight 2130gm, APGARs 9/9, intact placenta, true knot in cord B: Clear amniotic fluid, transvere (back up),  female infant, weight 2260gm, APGARs 8/9, intact placenta, true knot in cord, as well.   PROCEDURE IN DETAIL: The patient was taken to the operating room where anesthesia was administered and normal fetal heart tones were confirmed. She was then prepped and draped in the normal fashion in the dorsal supine position with a leftward tilt.  After a time out was performed, a Claretha Cooper  skin incision was made with the scalpel and carried through to the underlying layer of fascia. The fascia was then incised at the midline and this incision was extended laterally, and the rectus muscles were then separated in the midline and the peritoneum was entered bluntly. The bladder blade was inserted and the vesicouterine peritoneum was identified, and a low transverse hysterotomy was made with the scalpel until the endometrial cavity was breached and the amniotic sac ruptured with the Allis clamp, yielding clear  amniotic fluid. This incision was extended bluntly and the baby A's legs were grasped and the baby was then delivered breech using the standard maneuvers without issue. The cord was clamped x 2 and cut, and the infant was handed to the awaiting pediatricians, after delayed cord clamping was done.  Baby B's sac was then ruptured and this baby was then delivered breech without issue. The baby was not very vigorous so the cord was clamped x 2 and handed to the awaiting pediatricians. The placenta was then gradually expressed from the uterus and then the uterus was exteriorized and cleared of all clots and debris. The hysterotomy was repaired with a running suture of 1-0 monocryl. A second imbricating layer of 1-0 monocryl suture was then placed to achieve excellent hemostasis.   The uterus and adnexa were then returned to the abdomen, and the hysterotomy and all operative sites were reinspected and excellent hemostasis was noted after irrigation and suction of the abdomen with warm saline.  The peritoneum was closed with a running stitch of 3-0 Vicryl. The fascia was reapproximated with 0 Vicryl in a simple running fashion bilaterally. The subcutaneous layer was then reapproximated with interrupted sutures of 2-0 plain gut, and the skin was then closed with 4-0 monocryl, in a subcuticular fashion.  The patient  tolerated the procedure well. Sponge, lap, needle, and instrument counts were correct x 2. The patient was transferred to the recovery room awake, alert and breathing independently in stable condition.  Cornelia Copa MD Attending Center for [facility])

## 2021-08-09 NOTE — Transfer of Care (Signed)
Immediate Anesthesia Transfer of Care Note  Patient: Erin Harrison  Procedure(s) Performed: CESAREAN SECTION MULTI-GESTATIONAL  Patient Location: PACU  Anesthesia Type:Spinal  Level of Consciousness: awake, alert  and oriented  Airway & Oxygen Therapy: Patient Spontanous Breathing  Post-op Assessment: Report given to RN and Post -op Vital signs reviewed and stable  Post vital signs: Reviewed and stable  Last Vitals:  Vitals Value Taken Time  BP 109/76 08/09/21 1606  Temp    Pulse 67 08/09/21 1610  Resp 14 08/09/21 1610  SpO2 100 % 08/09/21 1610  Vitals shown include unvalidated device data.  Last Pain:  Vitals:   08/09/21 1110  TempSrc: Oral  PainSc: 7       Patients Stated Pain Goal: 7 (08/09/21 1110)  Complications: No notable events documented.

## 2021-08-09 NOTE — Anesthesia Preprocedure Evaluation (Addendum)
Anesthesia Evaluation  Patient identified by MRN, date of birth, ID band Patient awake    Reviewed: Allergy & Precautions, H&P , NPO status , Patient's Chart, lab work & pertinent test results  History of Anesthesia Complications Negative for: history of anesthetic complications  Airway Mallampati: II  TM Distance: >3 FB Neck ROM: full    Dental no notable dental hx. (+) Teeth Intact   Pulmonary neg pulmonary ROS,    Pulmonary exam normal breath sounds clear to auscultation       Cardiovascular negative cardio ROS Normal cardiovascular exam Rhythm:regular Rate:Normal     Neuro/Psych  Headaches, negative psych ROS   GI/Hepatic negative GI ROS, Neg liver ROS,   Endo/Other  negative endocrine ROS  Renal/GU negative Renal ROS  negative genitourinary   Musculoskeletal   Abdominal   Peds  Hematology  (+) Blood dyscrasia, anemia ,   Anesthesia Other Findings   Reproductive/Obstetrics (+) Pregnancy                             Anesthesia Physical  Anesthesia Plan  ASA: II  Anesthesia Plan: Spinal   Post-op Pain Management:    Induction:   PONV Risk Score and Plan: Treatment may vary due to age or medical condition  Airway Management Planned:   Additional Equipment:   Intra-op Plan:   Post-operative Plan:   Informed Consent: I have reviewed the patients History and Physical, chart, labs and discussed the procedure including the risks, benefits and alternatives for the proposed anesthesia with the patient or authorized representative who has indicated his/her understanding and acceptance.       Plan Discussed with:   Anesthesia Plan Comments:        Anesthesia Quick Evaluation

## 2021-08-09 NOTE — Discharge Summary (Signed)
Postpartum Discharge Summary  Date of Service updated 08/12/2021   Patient Name: Erin Harrison DOB: 1995/03/06 MRN: [id number]  Date of admission: 08/09/2021 Delivery date:   Kristell, Wooding [[id number]]  08/09/2021    Edit, Ricciardelli [[id number]]  08/09/2021 Delivering provider:    Samie, Barclift [[id number]]  Diana, Davenport [[id number]]  Aletha Halim Date of discharge: 08/12/2021  Admitting diagnosis: IUGR (intrauterine growth restriction) affecting care of mother [O36.5990] Intrauterine pregnancy: [redacted]w[redacted]d    Secondary diagnosis:  Principal Problem:   S/P cesarean section Active Problems:   Supervision of high risk pregnancy, antepartum   Twin pregnancy, twins dichorionic and diamniotic   Marginal insertion of umbilical cord affecting management of mother   IUGR (intrauterine growth restriction) affecting care of mother   Fetal malpresentation  Additional problems: None    Discharge diagnosis: Preterm Pregnancy Delivered                                              Post partum procedures:  Nexplanon insertion Augmentation: N/A Complications: None  Hospital course: Sceduled C/S - 26y.o. yo G3P1011 at 359w0das admitted to the hospital 08/09/2021 for scheduled cesarean section with the following indication: Di/Di twin gestation, fetal malpresentation, FGR twin A.  Delivery details are as follows:  Membrane Rupture Time/Date:    PeEmonie, Espericueta0[[id number]]3:12 PM    PeRenae, Mottley0[[id number]]3:17 PM ,   PeKerby, Hockley0[[id number]]08/09/2021    PeShannara, Winbush0[[id number]]08/09/2021   Delivery Method:   PePollie Friar0[[id number]]C-Section, Low Transverse    PeGaylon, Melchor0[[id number]]C-Section, Low Transverse  Details of operation can be found in separate operative note.  Patient had an uncomplicated postpartum course.  Her hemoglobin on POD#1  was stable at 10.9.  She is ambulating, tolerating a regular diet, passing flatus, and urinating well.  Pain has been well-controlled on oral pain medications. Had a lot of gas pain on POD#2, but has been passing gas and feeling much better on POD#3. No Sx of infection or other surgical complications.  Patient is discharged home in stable condition on  08/12/21        Newborn Data: Birth date:   PeJasmyn, Picha0[[id number]]08/09/2021    PeRasheeda, Mulvehill0[[id number]]08/09/2021 Birth time:   PeMailen, Newborn0[[id number]]3:12 PM    PeEarnestine, Shipp0[[id number]]3:17 PM Gender:   PeShanice, Poznanski0[[id number]]Female    PeLoyal, Rudy0[[id number]]Female Living status:   PeTenia, Goh0[[id number]]  RNHAFB    XUXYBFXOBoKERLY RIGSBEE0[[id number]]Living Apgars:   PeKyri, Shader0[[id number]]9 [address]AV[address]0[[id number]]  0 ,   EBXIDHWY, SHUOHeBowman0[[id number]]9 [address]0[[id number]]9 Weight:   PeChaniya, Genter0[[id number]]2130 g    PeTaia, Bramlett0[[id number]]2260 g     Magnesium Sulfate received: No BMZ received: No Rhophylac: N/A MMR: N/A T-DaP: Given prenatally Flu: No Transfusion: No  Physical exam  Vitals:   08/11/21 0610 08/11/21 1606 08/11/21 2200 08/12/21 0542  BP: 124/87  118/82 109/71 107/78  Pulse: 81 70 72 60  Resp:  _0 Temp:  98.2 F (36.8 C) 98.1 F (36.7 C) 98.1 F (36.7 C)  TempSrc:  Oral Oral Oral  SpO2:  99% 100% 96%  Weight:      Height:       General: alert, cooperative, and no distress Lochia: appropriate Uterine Fundus: firm Incision: Dressing is clean, dry, and intact DVT Evaluation: No evidence of DVT seen on physical exam. No cords or calf tenderness. No significant calf/ankle edema.  Labs: Lab Results  Component Value Date   WBC 13.6 (H) 08/10/2021   HGB 10.9 (L) 08/10/2021   HCT 33.1 (L) 08/10/2021    MCV 83.4 08/10/2021   PLT 212 08/10/2021      Latest Ref Rng & Units 08/10/2021    5:33 AM  CMP  Creatinine 0.44 - 1.00 mg/dL 0.61    Edinburgh Score:    08/09/2021    7:00 PM  Edinburgh Postnatal Depression Scale Screening Tool  I have been able to laugh and see the funny side of things. 0  I have looked forward with enjoyment to things. 0  I have blamed myself unnecessarily when things went wrong. 0  I have been anxious or worried for no good reason. 0  I have felt scared or panicky for no good reason. 0  Things have been getting on top of me. 0  I have been so unhappy that I have had difficulty sleeping. 0  I have felt sad or miserable. 0  I have been so unhappy that I have been crying. 0  The thought of harming myself has occurred to me. 0  Edinburgh Postnatal Depression Scale Total 0    After visit meds:  Allergies as of 08/12/2021   No Known Allergies      Medication List     TAKE these medications    oxyCODONE 5 MG immediate release tablet Commonly known as: Oxy IR/ROXICODONE Take 1-2 tablets (5-10 mg total) by mouth every 4 (four) hours as needed for moderate pain.   prenatal vitamin w/FE, FA 27-1 MG Tabs tablet Take 1 tablet by mouth in the morning and at bedtime.        Discharge home in stable condition Infant Feeding: Bottle, Breast, and now TF per NICU Infant Disposition: rooming in with infants in NICU Discharge instruction: per After Visit Summary and Postpartum booklet. Activity: Advance as tolerated. Pelvic rest for 6 weeks.  Diet: routine diet Future Appointments: Future Appointments  Date Time Provider [place]  08/16/2021 10:20 AM [facility] NURSE [facility]  09/17/2021  1:15 PM Griffin Basil, MD [facility]   Follow up Visit: Message sent to [facility] by Dr. Gwenlyn Perking on 08/09/21.   Please schedule this patient for a In person postpartum visit in 6 weeks with the following provider: Any provider. Additional Postpartum F/U: Incision  check 1 week  High risk pregnancy complicated by: Di/Di twins, malpresentation, FGR twin A Delivery mode:     Izel, Hochberg [[id number]]  C-Section, Low Transverse    Asuncion, Tapscott [[id number]]  C-Section, Low Transverse  Anticipated Birth Control:  Nexplanon   08/12/2021 Manya Silvas, CNM

## 2021-08-09 NOTE — Lactation Note (Signed)
This note was copied from a baby's chart. Lactation Consultation Note  Patient Name: Erin Harrison BULAG'T Date: 08/09/2021   Age:27 hours  LC talked with RN, [name]. Mom is resting currently, grandmother is bottle feeding twins DBM.  LC to follow up later in the day  Maternal Data    Feeding Nipple Type: Extra Slow Flow  LATCH Score                    Lactation Tools Discussed/Used    Interventions    Discharge    Consult Status      [name]  [name] 08/09/2021, 5:47 PM

## 2021-08-09 NOTE — Anesthesia Postprocedure Evaluation (Signed)
Anesthesia Post Note  Patient: Erin Harrison  Procedure(s) Performed: CESAREAN SECTION MULTI-GESTATIONAL     Patient location during evaluation: PACU Anesthesia Type: Epidural Level of consciousness: awake and alert Pain management: pain level controlled Vital Signs Assessment: post-procedure vital signs reviewed and stable Respiratory status: spontaneous breathing, nonlabored ventilation and respiratory function stable Cardiovascular status: blood pressure returned to baseline and stable Postop Assessment: no apparent nausea or vomiting Anesthetic complications: no   No notable events documented.  Last Vitals:  Vitals:   08/09/21 1645 08/09/21 1700  BP: 116/72 122/85  Pulse: 63 63  Resp: 14 11  Temp:    SpO2: 99% 99%    Last Pain:  Vitals:   08/09/21 1700  TempSrc:   PainSc: 0-No pain   Pain Goal: Patients Stated Pain Goal: 0 (08/09/21 1645)    LLE Sensation: Tingling (08/09/21 1700)   RLE Sensation: Tingling (08/09/21 1700)     Epidural/Spinal Function Cutaneous sensation: Able to Wiggle Toes (08/09/21 1700), Patient able to flex knees: Yes (08/09/21 1700), Patient able to lift hips off bed: No (08/09/21 1700), Back pain beyond tenderness at insertion site: No (08/09/21 1700), Progressively worsening motor and/or sensory loss: No (08/09/21 1700), Bowel and/or bladder incontinence post epidural: No (08/09/21 1700)  Lowella Curb

## 2021-08-09 NOTE — Anesthesia Procedure Notes (Signed)
Spinal  Patient location during procedure: OB Start time: 08/09/2021 2:34 PM End time: 08/09/2021 2:39 PM Reason for block: surgical anesthesia Staffing Performed: other anesthesia staff  Anesthesiologist: Lowella Curb, MD Performed by: Lowella Curb, MD Authorized by: Lowella Curb, MD   Preanesthetic Checklist Completed: patient identified, IV checked, risks and benefits discussed, surgical consent, monitors and equipment checked, pre-op evaluation and timeout performed Spinal Block Patient position: sitting Prep: DuraPrep and site prepped and draped Patient monitoring: heart rate, cardiac monitor, continuous pulse ox and blood pressure Approach: midline Location: L3-4 Injection technique: single-shot Needle Needle type: Pencan  Needle gauge: 24 G Needle length: 10 cm Assessment Sensory level: T4 Events: CSF return Additional Notes SAB placed by SRNA under direct supervision

## 2021-08-10 ENCOUNTER — Encounter (HOSPITAL_COMMUNITY): Payer: Self-pay | Admitting: Obstetrics and Gynecology

## 2021-08-10 ENCOUNTER — Encounter: Payer: Medicaid Other | Admitting: Obstetrics and Gynecology

## 2021-08-10 LAB — CREATININE, SERUM
Creatinine, Ser: 0.61 mg/dL (ref 0.44–1.00)
GFR, Estimated: >60 mL/min (ref >60)

## 2021-08-10 LAB — CBC
HCT: 33.1 % — ABNORMAL LOW (ref 36.0–46.0)
Hemoglobin: 10.9 g/dL — ABNORMAL LOW (ref 12.0–15.0)
MCH: 27.5 pg (ref 26.0–34.0)
MCHC: 32.9 g/dL (ref 30.0–36.0)
MCV: 83.4 fL (ref 80.0–100.0)
Platelets: 212 10*3/uL (ref 150–400)
RBC: 3.97 MIL/uL (ref 3.87–5.11)
RDW: 12.5 % (ref 11.5–15.5)
WBC: 13.6 10*3/uL — ABNORMAL HIGH (ref 4.0–10.5)
nRBC: 0 % (ref 0.0–0.2)

## 2021-08-10 LAB — CULTURE, BETA STREP (GROUP B ONLY): Strep Gp B Culture: NEGATIVE

## 2021-08-10 LAB — RPR: RPR Ser Ql: NONREACTIVE

## 2021-08-10 NOTE — Lactation Note (Addendum)
This note was copied from a baby's chart. Lactation Consultation Note  Patient Name: Erin Harrison POLID'C Date: 08/10/2021 Reason for consult: Initial assessment;Late-preterm 34-36.6wks;Infant < 6lbs;Primapara;Multiple gestation Age:27 hours Baby girl on the breast when LC came into rm. No swallows heard.  Discussed w/mom supplementing after BF and how long to feed babies on breast and bottle. Suggested to conserve energy BF for 10-15 minutes then supplement for no longer than 10 minutes. Gave LPI information sheet and reviewed as well as formula feeding if didn't feed on the breast. Baby girl has fed well. Baby has t-shirt on and double wrapped.  Mom stated babies has had low temps. Set up mom DEBP. Reviewed pumping. Mom shown how to use DEBP & how to disassemble, clean, & reassemble parts. Mom knows to pump q3h for 15-20 min.  Encouraged mom to call for assistance or questions.  Maternal Data    Feeding Nipple Type: Nfant Slow Flow (purple)  LATCH Score Latch: Grasps breast easily, tongue down, lips flanged, rhythmical sucking.  Audible Swallowing: None  Type of Nipple: Everted at rest and after stimulation  Comfort (Breast/Nipple): Soft / non-tender  Hold (Positioning): No assistance needed to correctly position infant at breast.  LATCH Score: 8   Lactation Tools Discussed/Used Tools: Pump Breast pump type: Double-Electric Breast Pump Pump Education: Setup, frequency, and cleaning;Milk Storage Reason for Pumping: less than 5 lbs/LPI Pumping frequency: q3hr  Interventions Interventions: Breast feeding basics reviewed;Skin to skin;Breast massage;Breast compression;Adjust position;Support pillows;Position options;DEBP;LC Services brochure;LPT handout/interventions;Pace feeding  Discharge    Consult Status Consult Status: Follow-up Date: 08/10/21 Follow-up type: In-patient    Charyl Dancer 08/10/2021, 12:23 AM

## 2021-08-10 NOTE — Progress Notes (Signed)
Patient screened out for psychosocial assessment since none of the following apply:  Psychosocial stressors documented in mother or baby's chart  Gestation less than 32 weeks  Code at delivery   Infant with anomalies Please contact the Clinical Social Worker if specific needs arise, by MOB's request, or if MOB scores greater than 9/yes to question 10 on Edinburgh Postpartum Depression Screen.  Ameila Weldon, LCSW Clinical Social Worker [facility] Cell#: [phone]     

## 2021-08-10 NOTE — Progress Notes (Addendum)
POSTPARTUM PROGRESS NOTE  Post Operative Day #1 s/p pLTCS  Subjective:  Erin Harrison is a 26 y.o. G3P1011 s/p pLTCS for Di/Di twins with FGR in twin A at [redacted]w[redacted]d.  No acute events overnight. She is seen breast pumping. Pt denies problems with ambulating or po intake. She reports that her foley catheter was recently removed at the time of my assessment and thus she had not yet voided independently. She denies nausea or vomiting.  Pain is well controlled.  She has not had flatus. She has not had bowel movement.  Lochia small.   Objective: Blood pressure 111/90, pulse 94, temperature 97.6 F (36.4 C), temperature source Oral, resp. rate 18, height 5\' 2"  (1.575 m), weight 82.6 kg, SpO2 99 %, unknown if currently breastfeeding.  Physical Exam:  General: alert, cooperative and no distress Chest: no respiratory distress Heart:regular rate, distal pulses intact Abdomen: soft, nontender,  Uterine Fundus: firm, appropriately tender DVT Evaluation: No calf swelling or tenderness Extremities: No peripheral edema Skin: warm, dry; incision clean/dry/intact  Recent Labs    08/09/21 1150  HGB 10.9*  HCT 32.6*    Assessment/Plan: Erin Harrison is a 26 y.o. G3P1011 s/p pLTCS for Di/Di twins with FGR at [redacted]w[redacted]d   POD#1 - Doing well, discussed anticipatory guidance regarding pain, lochia and breastfeeding. Lochia appropriate and fundus firm, pain well-managed with PRN pain medication at this time. Monitor for UOP and flatus. Contraception: Desires Nexplanon Feeding: Breast/bottle Dispo: Plan for discharge within 24-48 hrs.   LOS: 1 day   12-17-1980, MD, CNM 08/10/2021, 6:01 AM   GME ATTESTATION:  I saw and evaluated the patient. I agree with the findings and the plan of care as documented in the resident's note and have made all necessary edits  08/12/2021, MD, MPH OB Fellow, Faculty Practice [facility], Center for [facility] 08/10/2021 3:49 PM

## 2021-08-10 NOTE — Lactation Note (Signed)
This note was copied from a baby's chart.  NICU Lactation Consultation Note  Patient Name: Erin Harrison Date: 08/10/2021 Age:27 hours   Subjective Reason for consult: Follow-up assessment; NICU baby; Multiple gestation  Lactation followed up with Erin Harrison on the NICU floor. She states that she has initiated pumping on the mother baby unit, but "nothing has come out." I reinforced teaching of pumping q3 hours and provided reassurance regarding the onset of lactogenesis II on days 3-5.  She was holding baby boy. She reports that baby girl has latched, but her son has not. We placed him STS with blankets over him on the left breast in cradle hold. I showed Erin Harrison how to position him at the breast and how to hand express colostrum onto his mouth.  He briefly rooted and opened his mouth for the breast, but he did not suckle. I educated on LPI feeding behaviors and norms. I then provided ABM to mother to provide via bottle.  Erin Harrison will [facility] up with the [facility]. She is interested in breastfeeding and would like a follow up appointment after discharge with an IBCLC. I agreed to place a referral for follow up.   Plan: Offer breast first and supplement her EBM or ABM after. Pump both breasts q3 hours.   Objective Infant data: Mother's Current Feeding Choice: Breast Milk and Formula  Infant feeding assessment Scale for Readiness: 1 Scale for Quality: 2   Maternal data: G3P1011  C-Section, Low Transverse  Current breast feeding challenges:: NICU; multiple gestation  Previous breastfeeding challenges?: Low milk supply; Lack of support; Other (Comment) (inconsistent feedings; lack of knowledge)  Does the patient have breastfeeding experience prior to this delivery?: Yes How long did the patient breastfeed?: a few weeks    Assessment Infant: LATCH Score: 5   Intervention/Plan Interventions: Breast feeding basics reviewed; Assisted with latch;  Skin to skin; Hand express; Adjust position; Support pillows; Education  Plan: Consult Status: NICU follow-up  NICU Follow-up type: New admission follow up; Verify absence of engorgement; Weekly NICU follow up    Walker Shadow 08/10/2021, 3:44 PM

## 2021-08-10 NOTE — Lactation Note (Signed)
This note was copied from a baby's chart. Lactation Consultation Note Baby boy wouldn't latch to Breast. LC gave formula in bottle. FOB held baby after feeding. Baby had t-shirt on and double wrapped. Temps are low. Baby is sleepy. Encouraged mom to pump.  Patient Name: Erin Harrison JIZXY'O Date: 08/10/2021 Reason for consult: Initial assessment;Infant < 6lbs;Primapara;Multiple gestation Age:27 hours  Maternal Data    Feeding Mother's Current Feeding Choice: Breast Milk and Formula Nipple Type: Nfant Slow Flow (purple)  LATCH Score Latch: Too sleepy or reluctant, no latch achieved, no sucking elicited.  Audible Swallowing: None  Type of Nipple: Everted at rest and after stimulation  Comfort (Breast/Nipple): Soft / non-tender  Hold (Positioning): Full assist, staff holds infant at breast  LATCH Score: 4   Lactation Tools Discussed/Used    Interventions Interventions: Breast feeding basics reviewed;Assisted with latch;Skin to skin;DEBP;LC Services brochure;Pace feeding;LPT handout/interventions  Discharge    Consult Status Consult Status: Follow-up Date: 08/10/21 Follow-up type: In-patient    Ajmal Kathan, Diamond Nickel 08/10/2021, 1:28 AM

## 2021-08-11 MED ORDER — ONDANSETRON 4 MG PO TBDP
4.0000 mg | ORAL_TABLET | Freq: Three times a day (TID) | ORAL | Status: DC | PRN
  Administered 2021-08-11: 4 mg via ORAL
  Filled 2021-08-11: qty 1

## 2021-08-11 MED ORDER — POLYETHYLENE GLYCOL 3350 17 G PO PACK
17.0000 g | PACK | Freq: Every day | ORAL | Status: DC | PRN
Start: 2021-08-11 — End: 2021-08-13

## 2021-08-11 MED ORDER — ETONOGESTREL 68 MG ~~LOC~~ IMPL
68.0000 mg | DRUG_IMPLANT | Freq: Once | SUBCUTANEOUS | Status: AC
  Administered 2021-08-12: 68 mg via SUBCUTANEOUS
  Filled 2021-08-11: qty 1

## 2021-08-11 MED ORDER — LIDOCAINE HCL 1 % IJ SOLN
0.0000 mL | Freq: Once | INTRAMUSCULAR | Status: AC | PRN
  Administered 2021-08-12: 20 mL via INTRADERMAL
  Filled 2021-08-11: qty 20

## 2021-08-11 MED ORDER — FAMOTIDINE 20 MG PO TABS
10.0000 mg | ORAL_TABLET | Freq: Three times a day (TID) | ORAL | Status: DC | PRN
  Administered 2021-08-11: 10 mg via ORAL
  Filled 2021-08-11: qty 1

## 2021-08-11 MED ORDER — SENNOSIDES-DOCUSATE SODIUM 8.6-50 MG PO TABS
2.0000 | ORAL_TABLET | Freq: Every day | ORAL | Status: DC
  Administered 2021-08-11 – 2021-08-12 (×2): 2 via ORAL
  Filled 2021-08-11 (×2): qty 2

## 2021-08-11 MED ORDER — CALCIUM CARBONATE ANTACID 500 MG PO CHEW
1.0000 | CHEWABLE_TABLET | Freq: Three times a day (TID) | ORAL | Status: DC | PRN
Start: 2021-08-11 — End: 2021-08-13
  Administered 2021-08-11: 200 mg via ORAL
  Filled 2021-08-11: qty 1

## 2021-08-11 MED ORDER — SENNA 8.6 MG PO TABS
1.0000 | ORAL_TABLET | Freq: Every day | ORAL | Status: DC
  Filled 2021-08-11: qty 1

## 2021-08-11 MED ORDER — ONDANSETRON HCL 4 MG/2ML IJ SOLN: 4.0000 mg | Freq: Three times a day (TID) | INTRAMUSCULAR | Status: DC | PRN

## 2021-08-11 NOTE — Progress Notes (Signed)
POSTPARTUM PROGRESS NOTE  Post Operative Day #2 s/p pLTCS  Subjective:  Erin Harrison is a 26 y.o. G3P1011 s/p pLTCS for Di/Di twins with FGR in twin A at [redacted]w[redacted]d.  No acute events overnight.  Pt denies problems with ambulating, voiding or po intake.  She reports some nausea without vomiting starting this AM after eating shrimp/chicken hibachi. Also reports a lot of gas, using simethicone PRN.  Pain is well controlled with oral medications.  She has had flatus. She has not had bowel movement.  Lochia Minimal.   Objective: Blood pressure 124/87, pulse 81, temperature (!) 97.5 F (36.4 C), temperature source Oral, resp. rate 18, height 5\' 2"  (1.575 m), weight 82.6 kg, SpO2 100 %, unknown if currently breastfeeding.  Physical Exam:  General: alert, cooperative and no distress Chest: no respiratory distress Heart:regular rate, distal pulses intact Abdomen: soft, nontender,  Uterine Fundus: firm, appropriately tender DVT Evaluation: No calf swelling or tenderness Extremities: No peripheral edema Skin: warm, dry; incision clean/dry/intact  Recent Labs    08/09/21 1150 08/10/21 0533  HGB 10.9* 10.9*  HCT 32.6* 33.1*    Assessment/Plan: Erin Harrison is a 26 y.o. G3P1011 s/p pLTCS for Di/Di twins with FGR at [redacted]w[redacted]d   POD#2 - Doing well. Treating nausea with zofran PRN, may be secondary to opioid medications vs PO intake. Voiding and passing flatus, appropriate lochia and fundus remains firm with pain well-controlled.  Contraception: Desires Nexplanon, does have Medicaid and can be placed inpatient  Feeding: Breast and bottle; difficulty with boy latching Dispo: Plan for discharge within 24 hrs, may require rooming in with twins pending NICU recommendations.   LOS: 2 days   [redacted]w[redacted]d, MD 08/11/2021, 7:49 AM

## 2021-08-12 DIAGNOSIS — Z30017 Encounter for initial prescription of implantable subdermal contraceptive: Secondary | ICD-10-CM

## 2021-08-12 MED ORDER — OXYCODONE HCL 5 MG PO TABS
5.0000 mg | ORAL_TABLET | Freq: Four times a day (QID) | ORAL | 0 refills | Status: AC | PRN
Start: 2021-08-12 — End: ?

## 2021-08-12 MED ORDER — OXYCODONE HCL 5 MG PO TABS: 5.0000 mg | ORAL_TABLET | ORAL | 0 refills | Status: DC | PRN

## 2021-08-12 MED ORDER — ACETAMINOPHEN 500 MG PO TABS: 1000.0000 mg | ORAL_TABLET | Freq: Four times a day (QID) | ORAL | 2 refills | Status: DC | PRN

## 2021-08-12 MED ORDER — POLYETHYLENE GLYCOL 3350 17 GM/SCOOP PO POWD: 17.0000 g | Freq: Every day | ORAL | 2 refills | Status: AC | PRN

## 2021-08-12 MED ORDER — IBUPROFEN 600 MG PO TABS: 600.0000 mg | ORAL_TABLET | Freq: Four times a day (QID) | ORAL | 1 refills | Status: AC | PRN

## 2021-08-12 NOTE — Lactation Note (Signed)
This note was copied from a baby's chart.  NICU Lactation Consultation Note  Patient Name: Erin Harrison TXHFS'F Date: 08/12/2021 Age:27 hours   Subjective Reason for consult: Follow-up assessment; Primapara; 1st time breastfeeding; NICU baby; Late-preterm 34-36.6wks; Multiple gestation  Lactation followed up with Ms. Breeland on Mother Baby unit. She anticipates discharge today and plans to stay in NICU.  I reviewed pumping basics. Ms. Broda has been able to express some EBM, but her pumping has been inconsistent.   I encouraged her to pump q2-3 hours during the day q3-4 hours at night. We also discussed helping with breastfeeding on the NICU floor.  Objective Infant data: Mother's Current Feeding Choice: Breast Milk and Formula  Infant feeding assessment Scale for Readiness: 1 Scale for Quality: 2  Maternal data: G3P1011  C-Section, Low Transverse  Current breast feeding challenges:: NICU  Pumping frequency: last pumped yesterday Pumped volume: 5 mL   Pump: Personal (Luna Motif)  Assessment  Maternal: Milk volume: Normal  Intervention/Plan Interventions: Breast feeding basics reviewed; DEBP; Education  Tools: Pump Pump Education: Setup, frequency, and cleaning  Plan: Consult Status: NICU follow-up  NICU Follow-up type: Verify onset of copious milk; Verify absence of engorgement; New admission follow up    Walker Shadow 08/12/2021, 10:14 AM

## 2021-08-12 NOTE — Discharge Instructions (Signed)
NEXPLANON INSERTION POST-PROCEDURE INSTRUCTIONS  Keep the pressure dressing on for 24 hours. If it is too tight you may loosen it. Keep a band-aid on the site for five to seven days.  You may take Ibuprofen, Aleve or Tylenol for pain if needed.    You may have a small amount of bleeding from the site.   Call the office if you have fever greater that 100.4, significant bleeding, redness, swelling or pus draining from the incision.   Keep area dry for 24 hours.

## 2021-08-12 NOTE — Procedures (Signed)
Post-Placental Nexplanon Insertion Procedure Note  Patient was identified. Informed consent was signed, signed copy in chart. A time-out was performed.    The insertion site was identified 8-10 cm (3-4 inches) from the medial epicondyle of the humerus and 3-5 cm (1.25-2 inches) posterior to (below) the sulcus (groove) between the biceps and triceps muscles of the patient's left arm and marked. The site was prepped and draped in the usual sterile fashion. Pt was prepped with alcohol swab and then injected with 2 cc of 1% lidocaine. The site was prepped with betadine. Nexplanon removed form packaging,  Device confirmed in needle, then inserted full length of needle and withdrawn per handbook instructions. Provider and patient verified presence of the implant in the woman's arm by palpation. Pt insertion site was covered with steristrips/adhesive bandage and pressure bandage. There was minimal blood loss. Patient tolerated procedure well.  Patient was given post procedure instructions and Nexplanon user card with expiration date. Condoms were recommended for STI prevention. Patient was asked to keep the pressure dressing on for 24 hours to minimize bruising and keep the adhesive bandage on for 3-5 days. The patient verbalized understanding of the plan of care and agrees.   Lot # [id number] Expiration Date 06/20/23

## 2021-08-13 ENCOUNTER — Ambulatory Visit: Payer: Self-pay

## 2021-08-13 LAB — SURGICAL PATHOLOGY

## 2021-08-13 NOTE — Lactation Note (Signed)
This note was copied from a baby's chart.  NICU Lactation Consultation Note  Patient Name: Erin Harrison [id number] Date: 08/13/2021 Age:27 days   Subjective Reason for consult: Follow-up assessment; Mother's request; Primapara; 1st time breastfeeding  I followed up with Erin Harrison on the NICU floor. Her babies, Erin Harrison and Erin Harrison, are bottle feeding. She states that they are doing well with their bottles. Her feeding plan is to exclusively pump and bottle feed.  She has all needed supplies for pumping. I reviewed pumping basics including the recommendation of pumping q3 hours to increase her milk volume.  She notes that her milk volume increased yesterday to 35 mls. She denies engorgement. It has been 12 hours since her last pumping session. We discussed the importance of early and consistent pumping to setting a good baseline for her milk production. I recommended that she download a free breast pumping app.  Erin Harrison will follow up with the [facility]. I placed  a referral to the lactation consultant there.  Objective Infant data: Mother's Current Feeding Choice: Breast Milk and Formula  Infant feeding assessment Scale for Readiness: 2 Scale for Quality: 2  Maternal data: G3P1011  C-Section, Low Transverse  Current breast feeding challenges:: NICU   Does the patient have breastfeeding experience prior to this delivery?: No  Pumping frequency: several times a day - recommended q3 hours Pumped volume: 35 mL  Risk factor for low milk supply:: inconsistent pumping   Pump: Manual, DEBP  Assessment Maternal: Milk volume: Normal   Intervention/Plan Interventions: Breast feeding basics reviewed; Education  Tools: Pump Pump Education: Setup, frequency, and cleaning  Plan: Consult Status: NICU follow-up  NICU Follow-up type: Verify onset of copious milk; Verify absence of engorgement    Erin Harrison 08/13/2021, 9:16 AM

## 2021-08-14 ENCOUNTER — Encounter (HOSPITAL_COMMUNITY): Payer: Self-pay | Admitting: Obstetrics and Gynecology

## 2021-08-16 ENCOUNTER — Other Ambulatory Visit: Payer: Self-pay

## 2021-08-16 ENCOUNTER — Ambulatory Visit (INDEPENDENT_AMBULATORY_CARE_PROVIDER_SITE_OTHER): Payer: Medicaid Other | Admitting: General Practice

## 2021-08-16 ENCOUNTER — Encounter: Payer: Self-pay | Admitting: General Practice

## 2021-08-16 VITALS — BP 131/85 | HR 71 | Ht 62.0 in | Wt 169.0 lb

## 2021-08-16 DIAGNOSIS — Z5189 Encounter for other specified aftercare: Secondary | ICD-10-CM

## 2021-08-16 NOTE — Progress Notes (Signed)
Patient presents to office today for incision check following up from primary c-section on 7/13. Patient reports doing well since then. Incision appears to be healing well and is clean, dry & intact. Patient will follow up at pp visit on 8/21.  Chase Caller RN BSN 08/16/21

## 2021-08-17 ENCOUNTER — Telehealth: Payer: Medicaid Other | Admitting: Obstetrics & Gynecology

## 2021-08-19 ENCOUNTER — Ambulatory Visit: Payer: Self-pay

## 2021-08-19 NOTE — Lactation Note (Signed)
This note was copied from a baby's chart.  NICU Lactation Consultation Note  Patient Name: Erin Harrison [id number] Date: 08/19/2021 Age:27 days  Subjective Reason for consult: Follow-up assessment; Primapara; 1st time breastfeeding; NICU baby; Infant < 6lbs; Early term 37-38.6wks; Multiple gestation  LC in the room to visit with mom but NICU RN Chyrel Masson reported that parents just left, both babies got discharged today. Per RN parents didn't have any questions/concerns regarding lactation and MOB voiced she does have a DEBP for home use but that pumping hasn't been consistent. Previous LC asked for an ambulatory referral to lactation order, this LC secured message NP Porfirio Mylar to confirm referral, twins will [facility] up at the [facility] with [facility], she was Cc and notified of referral as well. Babies have been mostly on formula while in the NICU, they got discharged with Similac 22 calorie formula, NP also faxed a prescription to the [facility] office. Parents aware of LC services and will contact PRN.  Objective Infant data: Mother's Current Feeding Choice: Breast Milk and Formula  Infant feeding assessment Scale for Readiness: 1 Scale for Quality: 2  Maternal data: [id number]  C-Section, Low Transverse Pump: Personal, Manual  Assessment Infant: Feeding Status: Ad lib  Intervention/Plan  Plan: Consult Status: Complete   Kaushik Maul S Shatiqua Heroux 08/19/2021, 2:11 PM

## 2021-08-24 ENCOUNTER — Encounter: Payer: Medicaid Other | Admitting: Obstetrics and Gynecology

## 2021-08-31 ENCOUNTER — Encounter: Payer: Medicaid Other | Admitting: Obstetrics and Gynecology

## 2021-09-07 ENCOUNTER — Encounter: Payer: Medicaid Other | Admitting: Obstetrics and Gynecology

## 2021-09-14 NOTE — Progress Notes (Deleted)
    Post Partum Visit Note  Erin Harrison is a 26 y.o. [phone] female who presents for a postpartum visit. She is 5 weeks postpartum following a primary cesarean section.  I have fully reviewed the prenatal and intrapartum course. The delivery was at [redacted]w[redacted]d gestational weeks.  Anesthesia: spinal. Postpartum course has been ***. Baby is doing well***. Baby is feeding by both breast and bottle - {formula:72}. Bleeding {vag bleed:12292}. Bowel function is {normal:32111}. Bladder function is {normal:32111}. Patient {is/is not:9024} sexually active. Contraception method is  anticipated Nexplanon . Postpartum depression screening: {gen negative/positive:315881}.   The pregnancy intention screening data noted above was reviewed. Potential methods of contraception were discussed. The patient elected to proceed with No data recorded.    Health Maintenance Due  Topic Date Due   HPV VACCINES (1 - 2-dose series) Never done   PAP-Cervical Cytology Screening  Never done   COVID-19 Vaccine (3 - Pfizer series) 09/24/2019   INFLUENZA VACCINE  08/28/2021    {Common ambulatory SmartLinks:19316}  Review of Systems {ros; complete:30496}  Objective:  LMP  (LMP Unknown) Comment: Beginning of November   General:  {gen appearance:16600}   Breasts:  {desc; normal/abnormal/not indicated:14647}  Lungs: {lung exam:16931}  Heart:  {heart exam:5510}  Abdomen: {abdomen exam:16834}   Wound {Wound assessment:11097}  GU exam:  {desc; normal/abnormal/not indicated:14647}       Assessment:    There are no diagnoses linked to this encounter.  *** postpartum exam.   Plan:   Essential components of care per ACOG recommendations:  1.  Mood and well being: Patient with {gen negative/positive:315881} depression screening today. Reviewed local resources for support.  - Patient tobacco use? {tobacco use:25506}  - hx of drug use? {yes/no:25505}    2. Infant care and feeding:  -Patient currently breastmilk  feeding? {yes/no:25502}  -Social determinants of health (SDOH) reviewed in EPIC. No concerns***The following needs were identified***  3. Sexuality, contraception and birth spacing - Patient {DOES_DOES ZCH:88502} want a pregnancy in the next year.  Desired family size is {NUMBER 1-10:22536} children.  - Reviewed reproductive life planning. Reviewed contraceptive methods based on pt preferences and effectiveness.  Patient desired {Upstream End Methods:24109} today.   - Discussed birth spacing of 18 months  4. Sleep and fatigue -Encouraged family/partner/community support of 4 hrs of uninterrupted sleep to help with mood and fatigue  5. Physical Recovery  - Discussed patients delivery and complications. She describes her labor as {description:25511} - Patient had a {CHL AMB DELIVERY:[phone]}. Patient had a {laceration:25518} laceration. Perineal healing reviewed. Patient expressed understanding - Patient has urinary incontinence? {yes/no:25515} - Patient {ACTION; IS/IS DXA:12878676} safe to resume physical and sexual activity  6.  Health Maintenance - HM due items addressed {Yes or If no, why not?:20788} - Last pap smear No results found for: "DIAGPAP" Pap smear {done:10129} at today's visit.  -Breast Cancer screening indicated? {indicated:25516}  7. Chronic Disease/Pregnancy Condition follow up: {Follow up:25499}  - PCP follow up  Aviva Signs, CMA [facility], [facility]

## 2021-09-17 ENCOUNTER — Ambulatory Visit: Payer: Medicaid Other | Admitting: Obstetrics and Gynecology

## 2021-09-17 NOTE — Progress Notes (Unsigned)
Post Partum Visit Note  Erin Harrison is a 26 y.o. [phone] female who presents for a postpartum visit. She is 6 weeks postpartum following a primary cesarean section for Di-Di with FGR and Breech presentation.  I have fully reviewed the prenatal and intrapartum course. The delivery was at 36 gestational weeks.  Anesthesia: spinal. Postpartum course has been complicated by CS and twins in NICU. Babies is doing well. Baby is feeding by bottle - Gerber Gentle & Similac Neosure . Bleeding brown. Bowel function is normal. Bladder function is normal. Patient is not sexually active. Contraception method is Nexplanon. Postpartum depression screening: negative.   The pregnancy intention screening data noted above was reviewed. Potential methods of contraception were discussed. The patient elected to proceed with No data recorded.   Edinburgh Postnatal Depression Scale - 09/19/21 1104       Edinburgh Postnatal Depression Scale:  In the Past 7 Days   I have been able to laugh and see the funny side of things. 0    I have looked forward with enjoyment to things. 0    I have blamed myself unnecessarily when things went wrong. 0    I have been anxious or worried for no good reason. 0    I have felt scared or panicky for no good reason. 0    Things have been getting on top of me. 0    I have been so unhappy that I have had difficulty sleeping. 0    I have felt sad or miserable. 0    I have been so unhappy that I have been crying. 0    The thought of harming myself has occurred to me. 0    Edinburgh Postnatal Depression Scale Total 0             Health Maintenance Due  Topic Date Due   HPV VACCINES (1 - 2-dose series) Never done   PAP-Cervical Cytology Screening  Never done   COVID-19 Vaccine (3 - Pfizer series) 09/24/2019   INFLUENZA VACCINE  08/28/2021    {Common ambulatory SmartLinks:19316}  Review of Systems {ros; complete:30496}  Objective:  BP 117/81   Pulse 77   Wt 171 lb  (77.6 kg)   LMP  (LMP Unknown) Comment: Beginning of November  Breastfeeding No   BMI 31.28 kg/m    General:  {gen appearance:16600}   Breasts:  {desc; normal/abnormal/not indicated:14647}  Lungs: {lung exam:16931}  Heart:  {heart exam:5510}  Abdomen: {abdomen exam:16834}   Wound {Wound assessment:11097}  GU exam:  {desc; normal/abnormal/not indicated:14647}       Assessment:    1. S/P cesarean section ***   *** postpartum exam.   Plan:   Essential components of care per ACOG recommendations:  1.  Mood and well being: Patient with {gen negative/positive:315881} depression screening today. Reviewed local resources for support.  - Patient tobacco use? {tobacco use:25506}  - hx of drug use? {yes/no:25505}    2. Infant care and feeding:  -Patient currently breastmilk feeding? {yes/no:25502}  -Social determinants of health (SDOH) reviewed in EPIC. No concerns***The following needs were identified***  3. Sexuality, contraception and birth spacing - Patient {DOES_DOES MGQ:67619} want a pregnancy in the next year.  Desired family size is {NUMBER 1-10:22536} children.  - Reviewed reproductive life planning. Reviewed contraceptive methods based on pt preferences and effectiveness.  Patient desired Hormonal Implant today.   - Discussed birth spacing of 18 months  4. Sleep and fatigue -Encouraged family/partner/community support of 4 hrs  of uninterrupted sleep to help with mood and fatigue  5. Physical Recovery  - Discussed patients delivery and complications. She describes her labor as good. - Patient had a {CHL AMB DELIVERY:[phone]}. Patient had a {laceration:25518} laceration. Perineal healing reviewed. Patient expressed understanding - Patient has urinary incontinence? {yes/no:25515} - Patient {ACTION; IS/IS OHY:07371062} safe to resume physical and sexual activity  6.  Health Maintenance - HM due items addressed {Yes or If no, why not?:20788} - Last pap smear No results  found for: "DIAGPAP" Pap smear {done:10129} at today's visit.  -Breast Cancer screening indicated? {indicated:25516}  7. Chronic Disease/Pregnancy Condition follow up: {Follow up:25499}  - PCP follow up  Federico Flake, MD [facility], [facility]

## 2021-09-19 ENCOUNTER — Other Ambulatory Visit (HOSPITAL_COMMUNITY)
Admission: RE | Admit: 2021-09-19 | Discharge: 2021-09-19 | Disposition: A | Discharge status: Home / Self Care | Payer: Medicaid Other | Source: Ambulatory Visit | Attending: Obstetrics and Gynecology | Admitting: Obstetrics and Gynecology

## 2021-09-19 ENCOUNTER — Other Ambulatory Visit: Payer: Self-pay

## 2021-09-19 ENCOUNTER — Encounter: Payer: Self-pay | Admitting: Family Medicine

## 2021-09-19 ENCOUNTER — Ambulatory Visit (INDEPENDENT_AMBULATORY_CARE_PROVIDER_SITE_OTHER): Payer: Medicaid Other | Admitting: Family Medicine

## 2021-09-19 DIAGNOSIS — Z98891 History of uterine scar from previous surgery: Secondary | ICD-10-CM | POA: Diagnosis not present

## 2021-09-19 DIAGNOSIS — O365931 Maternal care for other known or suspected poor fetal growth, third trimester, fetus 1: Secondary | ICD-10-CM

## 2021-09-19 DIAGNOSIS — Z975 Presence of (intrauterine) contraceptive device: Secondary | ICD-10-CM

## 2021-09-19 DIAGNOSIS — Z30017 Encounter for initial prescription of implantable subdermal contraceptive: Secondary | ICD-10-CM

## 2021-09-19 DIAGNOSIS — O0990 Supervision of high risk pregnancy, unspecified, unspecified trimester: Secondary | ICD-10-CM

## 2021-09-19 DIAGNOSIS — O321XX1 Maternal care for breech presentation, fetus 1: Secondary | ICD-10-CM

## 2021-09-19 DIAGNOSIS — O30043 Twin pregnancy, dichorionic/diamniotic, third trimester: Secondary | ICD-10-CM

## 2021-09-20 DIAGNOSIS — Z975 Presence of (intrauterine) contraceptive device: Secondary | ICD-10-CM | POA: Insufficient documentation

## 2021-09-24 LAB — CYTOLOGY - PAP
Chlamydia: NEGATIVE
Comment: NEGATIVE
Comment: NEGATIVE
Comment: NORMAL
Diagnosis: NEGATIVE
Neisseria Gonorrhea: NEGATIVE
Trichomonas: NEGATIVE

## 2021-09-26 ENCOUNTER — Encounter: Payer: Self-pay | Admitting: Family Medicine

## 2022-08-07 ENCOUNTER — Encounter (HOSPITAL_COMMUNITY): Payer: Self-pay

## 2022-08-07 ENCOUNTER — Ambulatory Visit (HOSPITAL_COMMUNITY)
Admission: EM | Admit: 2022-08-07 | Discharge: 2022-08-07 | Disposition: A | Discharge status: Home / Self Care | Payer: Medicaid Other | Attending: Emergency Medicine | Admitting: Emergency Medicine

## 2022-08-07 DIAGNOSIS — J069 Acute upper respiratory infection, unspecified: Secondary | ICD-10-CM | POA: Diagnosis not present

## 2022-08-07 DIAGNOSIS — U071 COVID-19: Secondary | ICD-10-CM | POA: Insufficient documentation

## 2022-08-07 DIAGNOSIS — R059 Cough, unspecified: Secondary | ICD-10-CM | POA: Diagnosis present

## 2022-08-07 LAB — POCT RAPID STREP A (OFFICE): Rapid Strep A Screen: NEGATIVE

## 2022-08-07 NOTE — ED Triage Notes (Signed)
Pt c/o cough, running nose, fever, and sore throat since yesterday. States had covid exposure. Denies taking any meds.

## 2022-08-07 NOTE — ED Provider Notes (Signed)
[facility]    CSN: [id number] Arrival date & time: 08/07/22  0920      History   Chief Complaint Chief Complaint  Patient presents with   Cough    HPI Erin Harrison is a 27 y.o. female.   Patient presents for evaluation of fever and chills body aches, nasal congestion, rhinorrhea, sore throat, productive cough and intermittent headaches..  Fever began 100.6.  Decreased appetite but tolerating some food.  Known exposure to COVID.  Has not attempted treatment of symptoms.  Denies respiratory history.  Non-smoker.  Past Medical History:  Diagnosis Date   Chlamydia    Headache    Hypoglycemia    Infection    UTI    Patient Active Problem List   Diagnosis Date Noted   Nexplanon in place 09/20/2021   S/P cesarean section 08/09/2021   Migraines 08/02/2021    Past Surgical History:  Procedure Laterality Date   CESAREAN SECTION MULTI-GESTATIONAL N/A 08/09/2021   Procedure: CESAREAN SECTION MULTI-GESTATIONAL;  Surgeon: [place] Bing, MD;  Location: [facility] LD [facility]S;  Service: Obstetrics;  Laterality: N/A;   DILATION AND CURETTAGE OF UTERUS     WISDOM TOOTH EXTRACTION      OB History     Gravida  3   Para  2   Term  1   Preterm  1   AB  1   Living  3      SAB      IAB  1   Ectopic      Multiple  1   Live Births  3            Home Medications    Prior to Admission medications   Medication Sig Start Date End Date Taking? Authorizing Provider  ibuprofen (ADVIL) 600 MG tablet Take 1 tablet (600 mg total) by mouth every 6 (six) hours as needed for moderate pain, cramping, fever or headache. Patient not taking: Reported on 09/19/2021 08/12/21   Katrinka Blazing, [place], CNM  oxyCODONE (OXY IR/ROXICODONE) 5 MG immediate release tablet Take 1-2 tablets (5-10 mg total) by mouth every 6 (six) hours as needed for moderate pain. Patient not taking: Reported on 09/19/2021 08/12/21   Katrinka Blazing, [place], CNM  polyethylene glycol powder [facility]) 17  GM/SCOOP powder Take 17 g by mouth daily as needed for moderate constipation or mild constipation. Patient not taking: Reported on 09/19/2021 08/12/21   Katrinka Blazing [place], CNM  prenatal vitamin w/FE, FA (PRENATAL 1 + 1) 27-1 MG TABS tablet Take 1 tablet by mouth in the morning and at bedtime. Patient not taking: Reported on 09/19/2021    [provider]  simethicone (MYLICON) 80 MG chewable tablet Chew 80 mg by mouth every 6 (six) hours as needed for flatulence. Patient not taking: Reported on 09/19/2021    [provider]    Family History Family History  Problem Relation Age of Onset   Thyroid disease Mother    Diabetes Father    Stroke Father    Cancer Maternal Grandmother        breast   Diabetes Paternal Grandfather    Hypertension Paternal Grandfather     Social History Social History   Tobacco Use   Smoking status: Never   Smokeless tobacco: Never  Vaping Use   Vaping Use: Never used  Substance Use Topics   Alcohol use: Not Currently    Comment: occ   Drug use: No     Allergies   Patient has no known allergies.  Review of Systems Review of Systems  Constitutional:  Positive for chills and fever. Negative for activity change, appetite change, diaphoresis, fatigue and unexpected weight change.  HENT:  Positive for congestion, rhinorrhea and sore throat. Negative for dental problem, drooling, ear discharge, ear pain, facial swelling, hearing loss, mouth sores, nosebleeds, postnasal drip, sinus pressure, sinus pain, sneezing, tinnitus, trouble swallowing and voice change.   Respiratory:  Positive for cough. Negative for apnea, choking, chest tightness, shortness of breath, wheezing and stridor.   Cardiovascular: Negative.   Gastrointestinal: Negative.   Musculoskeletal:  Positive for myalgias. Negative for arthralgias, back pain, gait problem, joint swelling, neck pain and neck stiffness.  Skin: Negative.   Neurological:  Positive for headaches.  Negative for dizziness, tremors, seizures, syncope, facial asymmetry, speech difficulty, weakness, light-headedness and numbness.     Physical Exam Triage Vital Signs ED Triage Vitals [08/07/22 0953]  Enc Vitals Group     BP 106/82     Pulse Rate 99     Resp 18     Temp 99.9 F (37.7 C)     Temp Source Oral     SpO2 97 %     Weight      Height      Head Circumference      Peak Flow      Pain Score 7     Pain Loc      Pain Edu?      Excl. in GC?    No data found.  Updated Vital Signs BP 106/82 (BP Location: Left Arm)   Pulse 99   Temp 99.9 F (37.7 C) (Oral)   Resp 18   SpO2 97%   Breastfeeding No   Visual Acuity Right Eye Distance:   Left Eye Distance:   Bilateral Distance:    Right Eye Near:   Left Eye Near:    Bilateral Near:     Physical Exam Constitutional:      Appearance: Normal appearance.  HENT:     Head: Normocephalic.     Right Ear: Tympanic membrane, ear canal and external ear normal.     Left Ear: Tympanic membrane, ear canal and external ear normal.     Nose: Congestion and rhinorrhea present.     Mouth/Throat:     Mouth: Mucous membranes are moist.     Pharynx: Posterior oropharyngeal erythema present.  Eyes:     Extraocular Movements: Extraocular movements intact.  Cardiovascular:     Rate and Rhythm: Normal rate and regular rhythm.     Pulses: Normal pulses.     Heart sounds: Normal heart sounds.  Pulmonary:     Effort: Pulmonary effort is normal.     Breath sounds: Normal breath sounds.  Skin:    General: Skin is warm and dry.  Neurological:     Mental Status: She is alert and oriented to person, place, and time. Mental status is at baseline.      UC Treatments / Results  Labs (all labs ordered are listed, but only abnormal results are displayed) Labs Reviewed  SARS CORONAVIRUS 2 (TAT 6-24 HRS)  POCT RAPID STREP A (OFFICE)    EKG   Radiology No results found.  Procedures Procedures (including critical care  time)  Medications Ordered in UC Medications - No data to display  Initial Impression / Assessment and Plan / UC Course  I have reviewed the triage vital signs and the nursing notes.  Pertinent labs & imaging results that were available during  my care of the patient were reviewed by me and considered in my medical decision making (see chart for details).  Viral URI with cough  Patient is in no signs of distress nor toxic appearing.  Vital signs are stable.  Low suspicion for pneumonia, pneumothorax or bronchitis and therefore will defer imaging.COVID test is pending, reviewed quarantine guidelines per CDC recommendations, seems healthy, does not qualify for antiviral treatment.May use additional over-the-counter medications as needed for supportive care.  May follow-up with urgent care as needed if symptoms persist or worsen.  Note given.   Final Clinical Impressions(s) / UC Diagnoses   Final diagnoses:  Viral URI with cough     Discharge Instructions      Your symptoms today are most likely being caused by a virus and should steadily improve in time it can take up to 7 to 10 days before you truly start to see a turnaround however things will get better  Rapid strep test is  COVID test is pending up to 24 hours, you will be notified of positive test results only.  Current quarantine guidelines are you may return to activity when 24 hours without fever, if still having symptoms at that time please wear mask until all symptoms have resolved.    You can take Tylenol and/or Ibuprofen as needed for fever reduction and pain relief.   For cough: honey 1/2 to 1 teaspoon (you can dilute the honey in water or another fluid).  You can also use guaifenesin and dextromethorphan for cough. You can use a humidifier for chest congestion and cough.  If you don't have a humidifier, you can sit in the bathroom with the hot shower running.      For sore throat: try warm salt water gargles, cepacol  lozenges, throat spray, warm tea or water with lemon/honey, popsicles or ice, or OTC cold relief medicine for throat discomfort.   For congestion: take a daily anti-histamine like Zyrtec, Claritin, and a oral decongestant, such as pseudoephedrine.  You can also use Flonase 1-2 sprays in each nostril daily.   It is important to stay hydrated: drink plenty of fluids (water, gatorade/powerade/pedialyte, juices, or teas) to keep your throat moisturized and help further relieve irritation/discomfort.    ED Prescriptions   None    PDMP not reviewed this encounter.   Valinda Hoar, NP 08/07/22 1025

## 2022-08-07 NOTE — Discharge Instructions (Addendum)
Your symptoms today are most likely being caused by a virus and should steadily improve in time it can take up to 7 to 10 days before you truly start to see a turnaround however things will get better  Rapid strep test is negative  COVID test is pending up to 24 hours, you will be notified of positive test results only.  Current quarantine guidelines are you may return to activity when 24 hours without fever, if still having symptoms at that time please wear mask until all symptoms have resolved.    You can take Tylenol and/or Ibuprofen as needed for fever reduction and pain relief.   For cough: honey 1/2 to 1 teaspoon (you can dilute the honey in water or another fluid).  You can also use guaifenesin and dextromethorphan for cough. You can use a humidifier for chest congestion and cough.  If you don't have a humidifier, you can sit in the bathroom with the hot shower running.      For sore throat: try warm salt water gargles, cepacol lozenges, throat spray, warm tea or water with lemon/honey, popsicles or ice, or OTC cold relief medicine for throat discomfort.   For congestion: take a daily anti-histamine like Zyrtec, Claritin, and a oral decongestant, such as pseudoephedrine.  You can also use Flonase 1-2 sprays in each nostril daily.   It is important to stay hydrated: drink plenty of fluids (water, gatorade/powerade/pedialyte, juices, or teas) to keep your throat moisturized and help further relieve irritation/discomfort.

## 2022-08-08 LAB — SARS CORONAVIRUS 2 (TAT 6-24 HRS): SARS Coronavirus 2: POSITIVE — AB

## 2022-08-12 IMAGING — US US MFM UA ADDL GEST
1 series · 13 of 28 positions shown · non-contrast
Comparison: none

[Series 1: us mfm ua addl gest · 28 acquisitions, 13 frames shown]
[im 2/28]
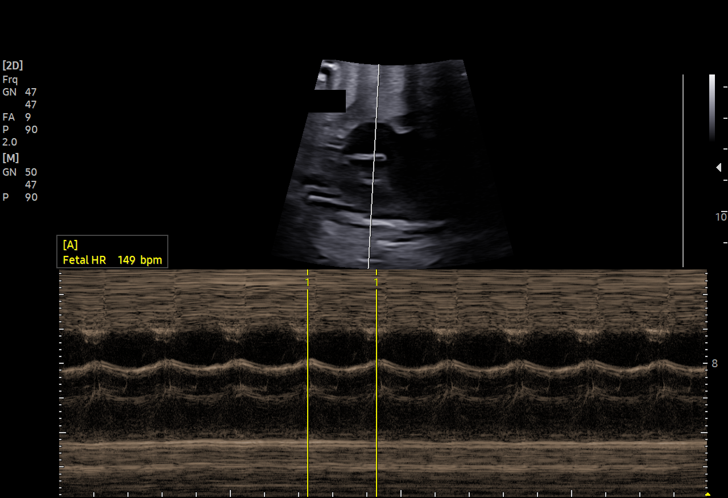
[im 4/28]
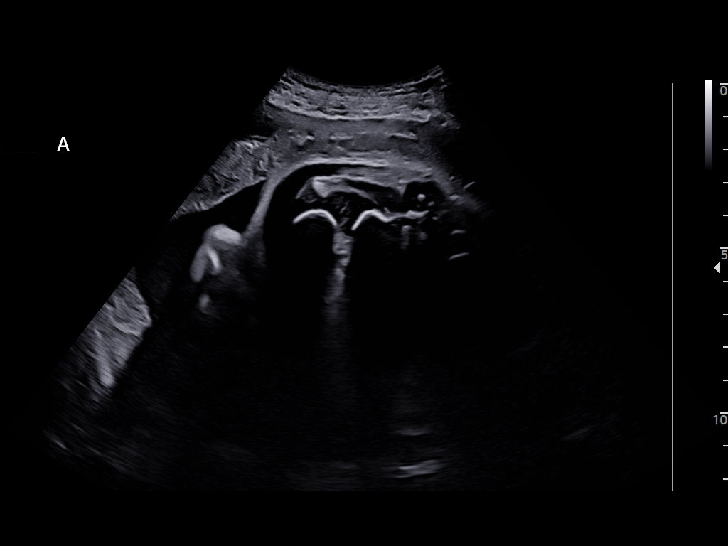
[im 6/28]
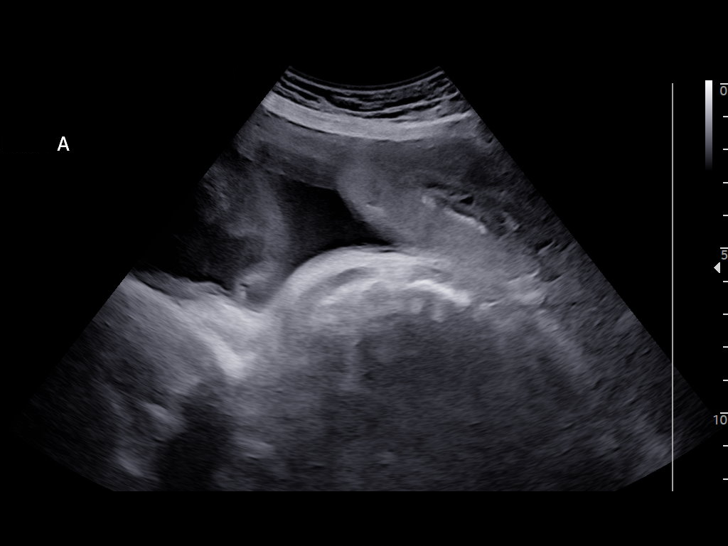
[im 8/28]
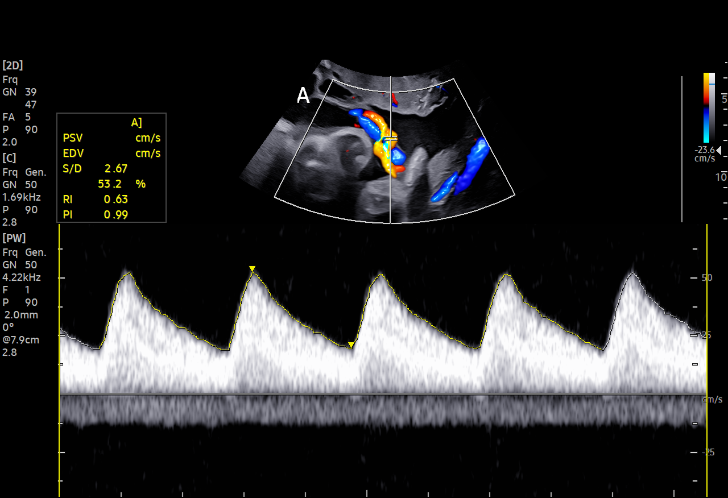
[im 10/28]
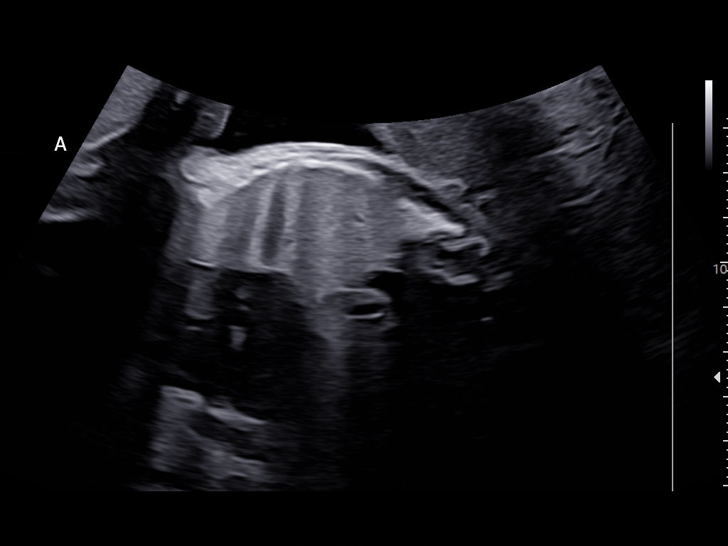
[im 12/28]
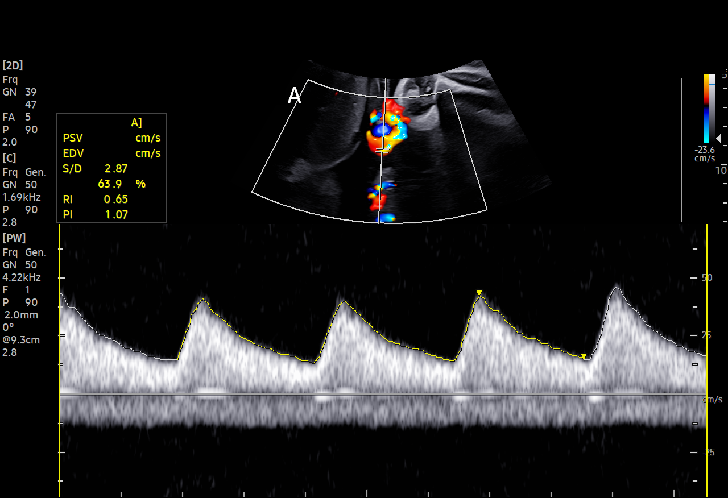
[im 15/28]
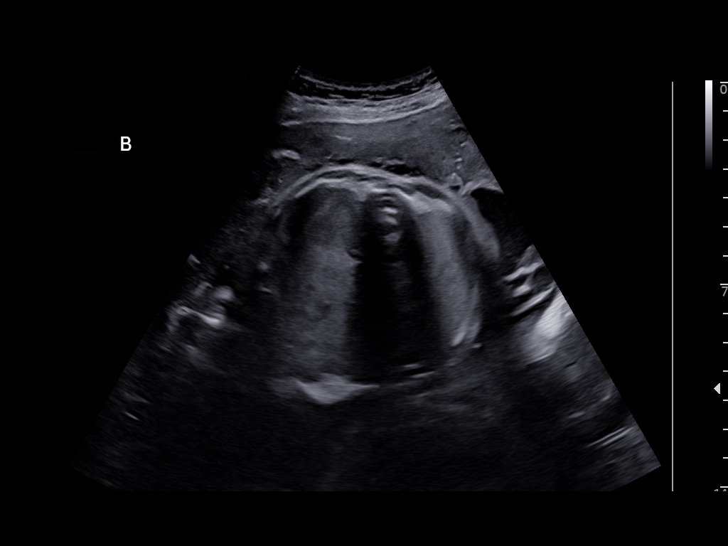
[im 17/28]
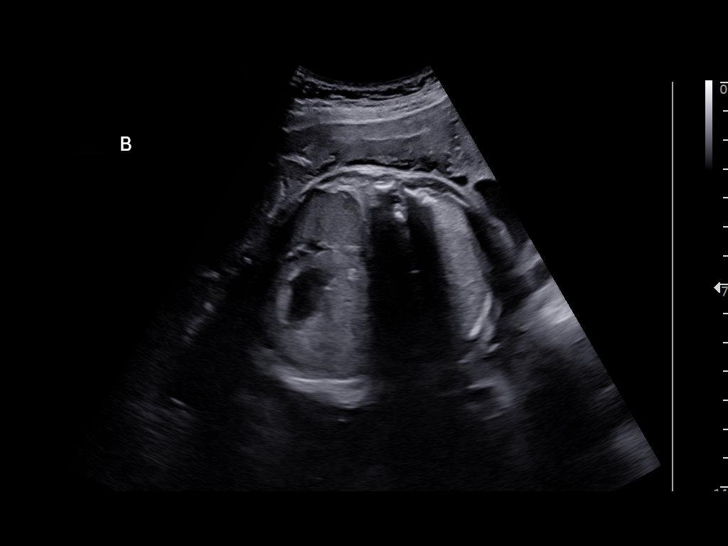
[im 19/28]
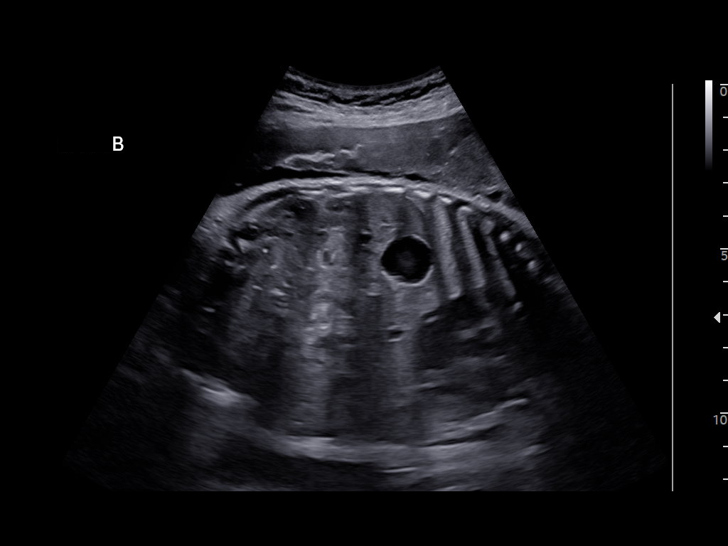
[im 21/28]
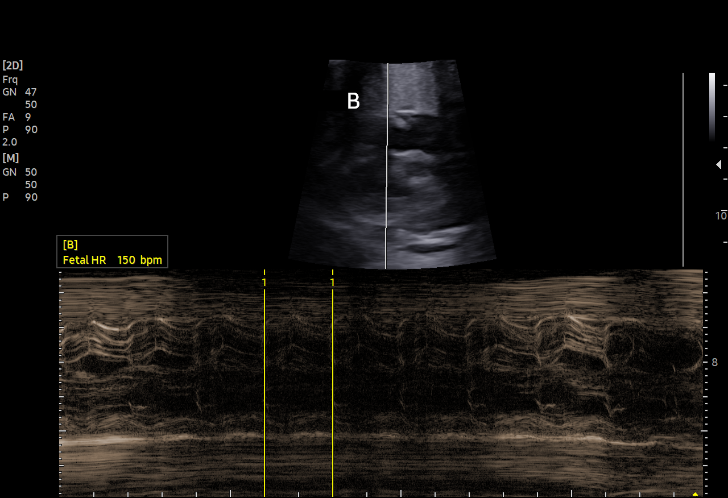
[im 23/28]
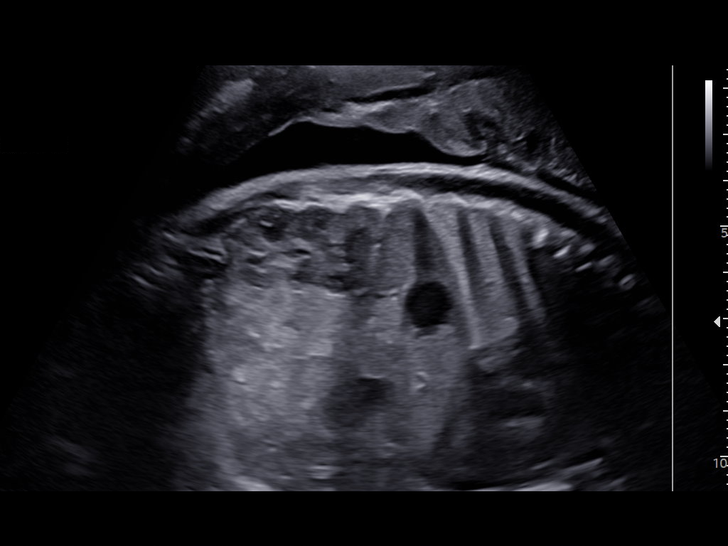
[im 25/28]
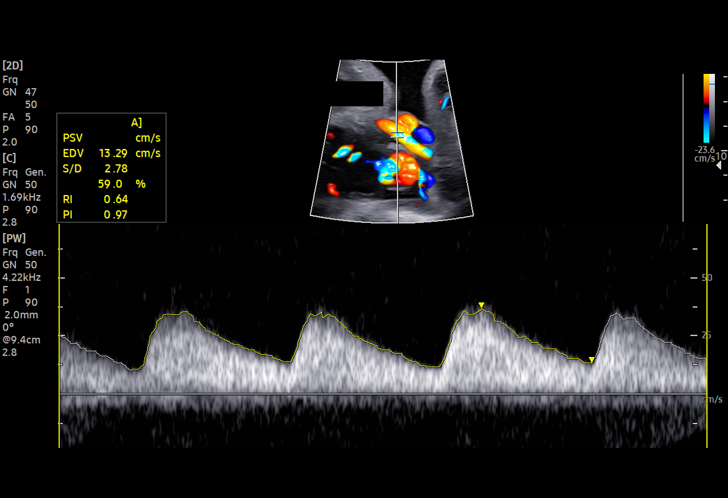
[im 27/28]
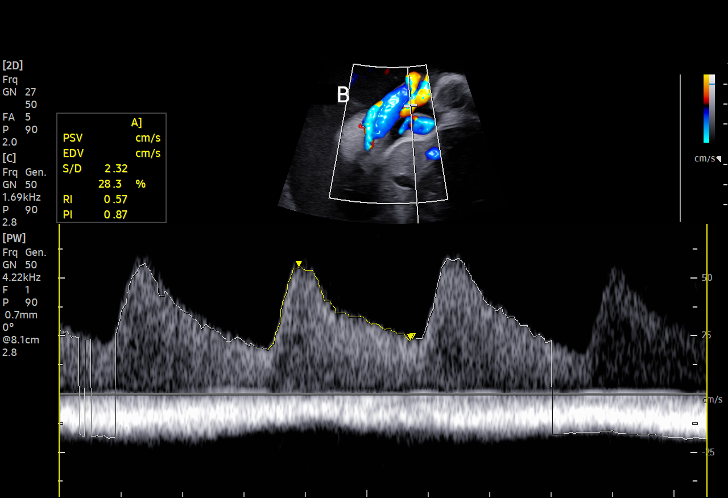

[13 of 28 positions shown; findings below may reference images not displayed]

ADDL GESTATION

Indications

 Maternal care for known or suspected poor
 fetal growth, third trimester, fetus 1 IUGR
 (Twin A)
 Twin pregnancy, di/di, third trimester
 Echogenic intracardiac focus of the heart
 (Twin A)
 32 weeks gestation of pregnancy
 LR NIPS, Neg Horizon
Fetal Evaluation (Fetus A)

 Num Of Fetuses:         2
 Fetal Heart Rate(bpm):  149
 Cardiac Activity:       Observed
 Fetal Lie:              Lower Fetus
 Presentation:           Breech
 Placenta:               Anterior
 P. Cord Insertion:      Previously Visualized
 Membrane Desc:      Dividing Membrane seen - Dichorionic.
 Amniotic Fluid
 AFI FV:      Within normal limits

                             Largest Pocket(cm)

Biophysical Evaluation (Fetus A)

 Amniotic F.V:   Within normal limits       F. Tone:        Observed
 F. Movement:    Observed                   Score:          [DATE]
 F. Breathing:   Observed
OB History

 Gravidity:    3
 Living:       1
Gestational Age (Fetus A)

 Best:          32w 5d     Det. By:  Previous Ultrasound      EDD:   09/06/21
                                     (03/02/21)
Doppler - Fetal Vessels (Fetus A)

 Umbilical Artery
  S/D     %tile      RI    %tile      PI    %tile     PSV    ADFV    RDFV
                                                    (cm/s)
  2.89       66    0.65       70    1.[REDACTED]      No      No

Fetal Evaluation (Fetus B)

 Num Of Fetuses:         2
 Fetal Heart Rate(bpm):  150
 Cardiac Activity:       Observed
 Fetal Lie:              Upper Fetus
 Presentation:           Transverse, head to maternal left
 Placenta:               Anterior
 P. Cord Insertion:      Previously Visualized
 Membrane Desc:      Dividing Membrane seen - Dichorionic.

 Amniotic Fluid
 AFI FV:      Within normal limits

                             Largest Pocket(cm)

Biophysical Evaluation (Fetus B)

 Amniotic F.V:   Within normal limits       F. Tone:        Observed
 F. Movement:    Observed                   Score:          [DATE]
 F. Breathing:   Observed
Gestational Age (Fetus B)

 Best:          32w 5d     Det. By:  Previous Ultrasound      EDD:   09/06/21
                                     (03/02/21)
Doppler - Fetal Vessels (Fetus B)

 Umbilical Artery
  S/D     %tile      RI    %tile      PI    %tile     PSV    ADFV    RDFV
                                                    (cm/s)
  2.58       47    0.61       53    0.[REDACTED]      No      No

Comments

 This patient was seen due to IUGR of twin A in a dichorionic,
 diamniotic twin gestation.  She denies any problems since
 her last exam.  She reports feeling vigorous fetal movements
 of both fetuses throughout the day.
 There was normal amniotic fluid noted on today's ultrasound
 exam around both twin A and twin B.
 Doppler studies of the umbilical arteries  showed a normal
 S/D ratio for both twin A and twin B.  There were no signs of
 absent or reversed end-diastolic flow noted today in either
 fetus.
 A biophysical profile performed today was [DATE] for both
 twin A and twin B.
 She will return in 1 week for another umbilical artery Doppler
 study, growth scan, and BPP.

## 2023-03-01 ENCOUNTER — Encounter (HOSPITAL_COMMUNITY): Payer: Self-pay

## 2023-03-01 ENCOUNTER — Ambulatory Visit (HOSPITAL_COMMUNITY)
Admission: EM | Admit: 2023-03-01 | Discharge: 2023-03-01 | Disposition: A | Discharge status: Home / Self Care | Payer: Self-pay

## 2023-03-01 DIAGNOSIS — R6889 Other general symptoms and signs: Secondary | ICD-10-CM

## 2023-03-01 MED ORDER — OSELTAMIVIR PHOSPHATE 75 MG PO CAPS: 75.0000 mg | ORAL_CAPSULE | Freq: Two times a day (BID) | ORAL | 0 refills | Status: AC

## 2023-03-01 MED ORDER — ONDANSETRON 4 MG PO TBDP: 4.0000 mg | ORAL_TABLET | Freq: Three times a day (TID) | ORAL | 0 refills | Status: AC | PRN

## 2023-03-01 NOTE — ED Provider Notes (Signed)
[facility]    CSN: [id number] Arrival date & time: 03/01/23  1518      History   Chief Complaint Chief Complaint  Patient presents with   Cough   Generalized Body Aches    HPI Erin Harrison is a 28 y.o. female.    She reports body aches, productive cough, night sweats, and decreased appetite since yesterday.  Her coworker tested positive for the flu as well.  Her onset of symptoms was yesterday afternoon.  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 Patient has tried Coricidin and tylenol with slight relief.  Additionally she does have 29-year-old twins at home who are in daycare.  She has not noticed a fever, but she has been taking Tylenol.  Denies nausea, vomiting at this time, she was nauseous yesterday when she tried to eat.  She has been able to stay hydrated.   Cough   Past Medical History:  Diagnosis Date   Chlamydia    Headache    Hypoglycemia    Infection    UTI    Patient Active Problem List   Diagnosis Date Noted   Nexplanon in place 09/20/2021   S/P cesarean section 08/09/2021   Migraines 08/02/2021    Past Surgical History:  Procedure Laterality Date   CESAREAN SECTION MULTI-GESTATIONAL N/A 08/09/2021   Procedure: CESAREAN SECTION MULTI-GESTATIONAL;  Surgeon: Nellieburg Bing, MD;  Location: [facility];  Service: Obstetrics;  Laterality: N/A;   DILATION AND CURETTAGE OF UTERUS     WISDOM TOOTH EXTRACTION      OB History     Gravida  3   Para  2   Term  1   Preterm  1   AB  1   Living  3      SAB      IAB  1   Ectopic      Multiple  1   Live Births  3            Home Medications    Prior to Admission medications   Medication Sig Start Date End Date Taking? Authorizing Provider  ondansetron (ZOFRAN-ODT) 4 MG disintegrating tablet Take 1 tablet (4 mg total) by mouth every 8 (eight) hours as needed for nausea or vomiting. 03/01/23  Yes Elmer Picker, NP  oseltamivir (TAMIFLU) 75 MG  capsule Take 1 capsule (75 mg total) by mouth every 12 (twelve) hours. 03/01/23  Yes Cashawn Yanko, Ludger Nutting, NP  Etonogestrel [facility]) Inject into the skin.    [provider]  ibuprofen (ADVIL) 600 MG tablet Take 1 tablet (600 mg total) by mouth every 6 (six) hours as needed for moderate pain, cramping, fever or headache. Patient not taking: Reported on 09/19/2021 08/12/21   Katrinka Blazing, [place], CNM  oxyCODONE (OXY IR/ROXICODONE) 5 MG immediate release tablet Take 1-2 tablets (5-10 mg total) by mouth every 6 (six) hours as needed for moderate pain. Patient not taking: Reported on 09/19/2021 08/12/21   Katrinka Blazing, [place], CNM  polyethylene glycol powder [facility]) 17 GM/SCOOP powder Take 17 g by mouth daily as needed for moderate constipation or mild constipation. Patient not taking: Reported on 09/19/2021 08/12/21   Katrinka Blazing [place], CNM  prenatal vitamin w/FE, FA (PRENATAL 1 + 1) 27-1 MG TABS tablet Take 1 tablet by mouth in the morning and at bedtime. Patient not taking: Reported on 09/19/2021    [provider]  simethicone (MYLICON) 80 MG chewable tablet Chew 80 mg by mouth  every 6 (six) hours as needed for flatulence. Patient not taking: Reported on 09/19/2021    [provider]    Family History Family History  Problem Relation Age of Onset   Thyroid disease Mother    Diabetes Father    Stroke Father    Cancer Maternal Grandmother        breast   Diabetes Paternal Grandfather    Hypertension Paternal Grandfather     Social History Social History   Tobacco Use   Smoking status: Never   Smokeless tobacco: Never  Vaping Use   Vaping status: Never Used  Substance Use Topics   Alcohol use: Not Currently    Comment: occ   Drug use: No     Allergies   Patient has no known allergies.   Review of Systems Review of Systems  Respiratory:  Positive for cough.      Physical Exam Triage Vital Signs ED Triage Vitals  Encounter Vitals Group     BP 03/01/23  1622 109/79     Systolic BP Percentile --      Diastolic BP Percentile --      Pulse Rate 03/01/23 1622 84     Resp 03/01/23 1622 16     Temp 03/01/23 1622 99.4 F (37.4 C)     Temp Source 03/01/23 1622 Oral     SpO2 03/01/23 1622 98 %     Weight 03/01/23 1622 213 lb (96.6 kg)     Height 03/01/23 1622 5\' 2"  (1.575 m)     Head Circumference --      Peak Flow --      Pain Score 03/01/23 1620 8     Pain Loc --      Pain Education --      Exclude from Growth Chart --    No data found.  Updated Vital Signs BP 109/79 (BP Location: Left Arm)   Pulse 84   Temp 99.4 F (37.4 C) (Oral)   Resp 16   Ht 5\' 2"  (1.575 m)   Wt 213 lb (96.6 kg)   LMP 02/26/2023 (Exact Date)   SpO2 98%   Breastfeeding No   BMI 38.96 kg/m   Visual Acuity Right Eye Distance:   Left Eye Distance:   Bilateral Distance:    Right Eye Near:   Left Eye Near:    Bilateral Near:     Physical Exam Vitals and nursing note reviewed.  Constitutional:      Appearance: She is well-developed. She is ill-appearing.  HENT:     Head: Normocephalic and atraumatic.  Eyes:     Conjunctiva/sclera: Conjunctivae normal.  Cardiovascular:     Rate and Rhythm: Normal rate and regular rhythm.     Heart sounds: No murmur heard. Pulmonary:     Effort: Pulmonary effort is normal. No respiratory distress.     Breath sounds: Normal breath sounds.  Abdominal:     Palpations: Abdomen is soft.     Tenderness: There is no abdominal tenderness.  Musculoskeletal:        General: No swelling.     Cervical back: Neck supple.  Skin:    General: Skin is warm and dry.     Capillary Refill: Capillary refill takes less than 2 seconds.  Neurological:     Mental Status: She is alert.  Psychiatric:        Mood and Affect: Mood normal.      UC Treatments / Results  Labs (all labs  ordered are listed, but only abnormal results are displayed) Labs Reviewed - No data to display  EKG   Radiology No results  found.  Procedures Procedures (including critical care time)  Medications Ordered in UC Medications - No data to display  Initial Impression / Assessment and Plan / UC Course  I have reviewed the triage vital signs and the nursing notes.  Pertinent labs & imaging results that were available during my care of the patient were reviewed by me and considered in my medical decision making (see chart for details).   High clinical suspicion for influenza given quick onset of symptoms and contacts testing positive for influenza.  Will go ahead and treat with Tamiflu given clinical suspicion.  Otherwise recommend supportive care for symptomatic relief as outlined in AVS.  Patient nontoxic appearing with hemodynamically stable vital signs, therefore deferred imaging of the chest.  Modes of transmission, quarantine recommendations, and hand hygiene discussed.  Final Clinical Impressions(s) / UC Diagnoses   Final diagnoses:  Flu-like symptoms     Discharge Instructions      You have the flu. Take Tamiflu every 12 hours for the next 5 days to improve symptoms and stop the virus from replicating in your body. Ibuprofen/tylenol as needed for fevers and body aches. Mucinex as needed for nasal congestion. Zofran as needed for nausea/vomiting.     ED Prescriptions     Medication Sig Dispense Auth. Provider   oseltamivir (TAMIFLU) 75 MG capsule Take 1 capsule (75 mg total) by mouth every 12 (twelve) hours. 10 capsule Elmer Picker, NP   ondansetron (ZOFRAN-ODT) 4 MG disintegrating tablet Take 1 tablet (4 mg total) by mouth every 8 (eight) hours as needed for nausea or vomiting. 20 tablet Elmer Picker, NP      PDMP not reviewed this encounter.   Elmer Picker, NP 03/01/23 [phone]

## 2023-03-01 NOTE — ED Triage Notes (Signed)
Patient having body aches, productive cough, sweats, and not wanting to eat. States her coworker has the flu. Onset of symptoms yesterday afternoon.   Patient has tried Coricidin and tylenol with slight relief.

## 2023-03-01 NOTE — Discharge Instructions (Addendum)
You have the flu. Take Tamiflu every 12 hours for the next 5 days to improve symptoms and stop the virus from replicating in your body. Ibuprofen/tylenol as needed for fevers and body aches. Mucinex as needed for nasal congestion. Zofran as needed for nausea/vomiting.
